# Patient Record
Sex: Male | Born: 1970 | Race: Black or African American | Hispanic: No | Smoking: Never smoker
Health system: Southern US, Community
[De-identification: ages and names within clinical notes are randomized; demographics above are authoritative.]

## PROBLEM LIST (undated history)

## (undated) DIAGNOSIS — Z9289 Personal history of other medical treatment: Secondary | ICD-10-CM

## (undated) DIAGNOSIS — E785 Hyperlipidemia, unspecified: Secondary | ICD-10-CM

## (undated) DIAGNOSIS — K921 Melena: Secondary | ICD-10-CM

## (undated) DIAGNOSIS — R197 Diarrhea, unspecified: Secondary | ICD-10-CM

## (undated) DIAGNOSIS — I10 Essential (primary) hypertension: Secondary | ICD-10-CM

## (undated) DIAGNOSIS — R112 Nausea with vomiting, unspecified: Secondary | ICD-10-CM

## (undated) DIAGNOSIS — E7800 Pure hypercholesterolemia, unspecified: Secondary | ICD-10-CM

## (undated) DIAGNOSIS — Z87898 Personal history of other specified conditions: Secondary | ICD-10-CM

## (undated) DIAGNOSIS — K219 Gastro-esophageal reflux disease without esophagitis: Secondary | ICD-10-CM

## (undated) DIAGNOSIS — G4730 Sleep apnea, unspecified: Secondary | ICD-10-CM

## (undated) HISTORY — DX: Hyperlipidemia, unspecified: E78.5

## (undated) HISTORY — DX: Sleep apnea, unspecified: G47.30

## (undated) HISTORY — DX: Nausea with vomiting, unspecified: R11.2

## (undated) HISTORY — DX: Melena: K92.1

## (undated) HISTORY — PX: VASECTOMY: SHX75

## (undated) HISTORY — DX: Diarrhea, unspecified: R19.7

## (undated) HISTORY — PX: FOOT SURGERY: SHX648

---

## 1998-01-13 ENCOUNTER — Emergency Department (HOSPITAL_COMMUNITY): Admission: EM | Admit: 1998-01-13 | Discharge: 1998-01-13 | Payer: Self-pay

## 1998-12-17 ENCOUNTER — Encounter: Payer: Self-pay | Admitting: Emergency Medicine

## 1998-12-17 ENCOUNTER — Emergency Department (HOSPITAL_COMMUNITY): Admission: EM | Admit: 1998-12-17 | Discharge: 1998-12-17 | Payer: Self-pay | Admitting: Emergency Medicine

## 1999-01-02 ENCOUNTER — Ambulatory Visit (HOSPITAL_BASED_OUTPATIENT_CLINIC_OR_DEPARTMENT_OTHER): Admission: RE | Admit: 1999-01-02 | Discharge: 1999-01-02 | Payer: Self-pay | Admitting: Orthopedic Surgery

## 2002-03-02 ENCOUNTER — Encounter: Payer: Self-pay | Admitting: Cardiology

## 2002-03-02 ENCOUNTER — Encounter: Admission: RE | Admit: 2002-03-02 | Discharge: 2002-03-02 | Payer: Self-pay | Admitting: Cardiology

## 2002-05-11 ENCOUNTER — Emergency Department (HOSPITAL_COMMUNITY): Admission: EM | Admit: 2002-05-11 | Discharge: 2002-05-11 | Payer: Self-pay | Admitting: Emergency Medicine

## 2005-10-13 ENCOUNTER — Ambulatory Visit: Payer: Self-pay | Admitting: Internal Medicine

## 2005-10-20 ENCOUNTER — Ambulatory Visit: Payer: Self-pay | Admitting: Internal Medicine

## 2007-03-19 ENCOUNTER — Ambulatory Visit: Payer: Self-pay | Admitting: Family Medicine

## 2007-05-16 ENCOUNTER — Telehealth (INDEPENDENT_AMBULATORY_CARE_PROVIDER_SITE_OTHER): Payer: Self-pay | Admitting: *Deleted

## 2007-05-16 ENCOUNTER — Emergency Department (HOSPITAL_COMMUNITY): Admission: EM | Admit: 2007-05-16 | Discharge: 2007-05-16 | Payer: Self-pay | Admitting: Emergency Medicine

## 2007-12-27 ENCOUNTER — Ambulatory Visit: Payer: Self-pay | Admitting: Internal Medicine

## 2007-12-27 LAB — CONVERTED CEMR LAB

## 2007-12-29 LAB — CONVERTED CEMR LAB
ALT: 37 units/L (ref 0–53)
AST: 28 units/L (ref 0–37)
Albumin: 4 g/dL (ref 3.5–5.2)
Alkaline Phosphatase: 58 units/L (ref 39–117)
BUN: 10 mg/dL (ref 6–23)
Basophils Absolute: 0 10*3/uL (ref 0.0–0.1)
Basophils Relative: 0 % (ref 0.0–1.0)
Bilirubin, Direct: 0.1 mg/dL (ref 0.0–0.3)
CO2: 31 meq/L (ref 19–32)
Calcium: 9.9 mg/dL (ref 8.4–10.5)
Chloride: 106 meq/L (ref 96–112)
Cholesterol: 278 mg/dL (ref 0–200)
Creatinine, Ser: 1.1 mg/dL (ref 0.4–1.5)
Direct LDL: 217.1 mg/dL
Eosinophils Absolute: 0.1 10*3/uL (ref 0.0–0.7)
Eosinophils Relative: 2.8 % (ref 0.0–5.0)
GFR calc Af Amer: 99 mL/min
GFR calc non Af Amer: 81 mL/min
Glucose, Bld: 76 mg/dL (ref 70–99)
HCT: 40 % (ref 39.0–52.0)
HDL: 31.2 mg/dL — ABNORMAL LOW (ref >39.0)
Hemoglobin: 13.1 g/dL (ref 13.0–17.0)
Lymphocytes Relative: 42.7 % (ref 12.0–46.0)
MCHC: 32.8 g/dL (ref 30.0–36.0)
MCV: 82.1 fL (ref 78.0–100.0)
Monocytes Absolute: 0.3 10*3/uL (ref 0.1–1.0)
Monocytes Relative: 6.4 % (ref 3.0–12.0)
Neutro Abs: 2.2 10*3/uL (ref 1.4–7.7)
Neutrophils Relative %: 48.1 % (ref 43.0–77.0)
Platelets: 181 10*3/uL (ref 150–400)
Potassium: 3.7 meq/L (ref 3.5–5.1)
RBC: 4.87 M/uL (ref 4.22–5.81)
RDW: 14 % (ref 11.5–14.6)
Sodium: 142 meq/L (ref 135–145)
TSH: 0.9 microintl units/mL (ref 0.35–5.50)
Total Bilirubin: 0.7 mg/dL (ref 0.3–1.2)
Total CHOL/HDL Ratio: 8.9
Total Protein: 7.9 g/dL (ref 6.0–8.3)
Triglycerides: 116 mg/dL (ref 0–149)
VLDL: 23 mg/dL (ref 0–40)
WBC: 4.6 10*3/uL (ref 4.5–10.5)

## 2008-04-27 ENCOUNTER — Encounter (INDEPENDENT_AMBULATORY_CARE_PROVIDER_SITE_OTHER): Payer: Self-pay | Admitting: *Deleted

## 2008-11-16 ENCOUNTER — Emergency Department (HOSPITAL_COMMUNITY): Admission: EM | Admit: 2008-11-16 | Discharge: 2008-11-16 | Payer: Self-pay | Admitting: Family Medicine

## 2008-11-22 ENCOUNTER — Telehealth (INDEPENDENT_AMBULATORY_CARE_PROVIDER_SITE_OTHER): Payer: Self-pay | Admitting: *Deleted

## 2008-12-04 ENCOUNTER — Encounter: Payer: Self-pay | Admitting: Internal Medicine

## 2009-03-25 ENCOUNTER — Encounter (INDEPENDENT_AMBULATORY_CARE_PROVIDER_SITE_OTHER): Payer: Self-pay | Admitting: *Deleted

## 2010-10-29 ENCOUNTER — Emergency Department (HOSPITAL_COMMUNITY): Payer: 59

## 2010-10-29 ENCOUNTER — Emergency Department (HOSPITAL_COMMUNITY)
Admission: EM | Admit: 2010-10-29 | Discharge: 2010-10-29 | Disposition: A | Discharge status: Home / Self Care | Payer: 59 | Attending: Emergency Medicine | Admitting: Emergency Medicine

## 2010-10-29 ENCOUNTER — Encounter (HOSPITAL_COMMUNITY): Payer: Self-pay | Admitting: Radiology

## 2010-10-29 DIAGNOSIS — R0989 Other specified symptoms and signs involving the circulatory and respiratory systems: Secondary | ICD-10-CM | POA: Insufficient documentation

## 2010-10-29 DIAGNOSIS — I440 Atrioventricular block, first degree: Secondary | ICD-10-CM | POA: Insufficient documentation

## 2010-10-29 DIAGNOSIS — R209 Unspecified disturbances of skin sensation: Secondary | ICD-10-CM | POA: Insufficient documentation

## 2010-10-29 DIAGNOSIS — R0609 Other forms of dyspnea: Secondary | ICD-10-CM | POA: Insufficient documentation

## 2010-10-29 DIAGNOSIS — R079 Chest pain, unspecified: Secondary | ICD-10-CM | POA: Insufficient documentation

## 2010-10-29 LAB — CBC
HCT: 41.4 % (ref 39.0–52.0)
Hemoglobin: 12.9 g/dL — ABNORMAL LOW (ref 13.0–17.0)
MCH: 25.9 pg — ABNORMAL LOW (ref 26.0–34.0)
MCHC: 31.2 g/dL (ref 30.0–36.0)
MCV: 83.1 fL (ref 78.0–100.0)
Platelets: 188 10*3/uL (ref 150–400)
RBC: 4.98 MIL/uL (ref 4.22–5.81)
RDW: 15.1 % (ref 11.5–15.5)
WBC: 6.4 10*3/uL (ref 4.0–10.5)

## 2010-10-29 LAB — BASIC METABOLIC PANEL
CO2: 27 mEq/L (ref 19–32)
Calcium: 9.7 mg/dL (ref 8.4–10.5)
Glucose, Bld: 91 mg/dL (ref 70–99)
Sodium: 139 mEq/L (ref 135–145)

## 2010-10-29 LAB — POCT CARDIAC MARKERS
CKMB, poc: 1.5 ng/mL (ref 1.0–8.0)
CKMB, poc: 7.9 ng/mL (ref 1.0–8.0)
Myoglobin, poc: 100 ng/mL (ref 12–200)
Troponin i, poc: <0.05 ng/mL (ref 0.00–0.09)

## 2010-10-29 LAB — DIFFERENTIAL
Basophils Absolute: 0 10*3/uL (ref 0.0–0.1)
Basophils Relative: 0 % (ref 0–1)
Eosinophils Absolute: 0.1 10*3/uL (ref 0.0–0.7)
Eosinophils Relative: 2 % (ref 0–5)
Lymphocytes Relative: 31 % (ref 12–46)
Lymphs Abs: 2 10*3/uL (ref 0.7–4.0)
Monocytes Absolute: 0.4 10*3/uL (ref 0.1–1.0)
Monocytes Relative: 6 % (ref 3–12)
Neutro Abs: 3.9 10*3/uL (ref 1.7–7.7)
Neutrophils Relative %: 61 % (ref 43–77)

## 2011-05-21 LAB — POCT RAPID STREP A: Streptococcus, Group A Screen (Direct): NEGATIVE

## 2011-08-29 ENCOUNTER — Emergency Department (HOSPITAL_COMMUNITY): Payer: 59

## 2011-08-29 ENCOUNTER — Emergency Department (HOSPITAL_COMMUNITY)
Admission: EM | Admit: 2011-08-29 | Discharge: 2011-08-29 | Disposition: A | Discharge status: Home / Self Care | Payer: 59 | Attending: Emergency Medicine | Admitting: Emergency Medicine

## 2011-08-29 ENCOUNTER — Encounter (HOSPITAL_COMMUNITY): Payer: Self-pay | Admitting: *Deleted

## 2011-08-29 DIAGNOSIS — R319 Hematuria, unspecified: Secondary | ICD-10-CM | POA: Insufficient documentation

## 2011-08-29 DIAGNOSIS — R109 Unspecified abdominal pain: Secondary | ICD-10-CM | POA: Insufficient documentation

## 2011-08-29 DIAGNOSIS — K922 Gastrointestinal hemorrhage, unspecified: Secondary | ICD-10-CM | POA: Insufficient documentation

## 2011-08-29 DIAGNOSIS — K921 Melena: Secondary | ICD-10-CM | POA: Insufficient documentation

## 2011-08-29 LAB — PROTIME-INR
INR: 0.97 (ref 0.00–1.49)
Prothrombin Time: 13.1 seconds (ref 11.6–15.2)

## 2011-08-29 LAB — CBC
HCT: 43.4 % (ref 39.0–52.0)
Platelets: 179 10*3/uL (ref 150–400)
RDW: 15.1 % (ref 11.5–15.5)
WBC: 6.2 10*3/uL (ref 4.0–10.5)

## 2011-08-29 LAB — URINE MICROSCOPIC-ADD ON

## 2011-08-29 LAB — COMPREHENSIVE METABOLIC PANEL
Albumin: 3.8 g/dL (ref 3.5–5.2)
Alkaline Phosphatase: 71 U/L (ref 39–117)
BUN: 12 mg/dL (ref 6–23)
Calcium: 9.6 mg/dL (ref 8.4–10.5)
Creatinine, Ser: 0.99 mg/dL (ref 0.50–1.35)
GFR calc Af Amer: >90 mL/min (ref >90)
Glucose, Bld: 98 mg/dL (ref 70–99)
Total Protein: 7.9 g/dL (ref 6.0–8.3)

## 2011-08-29 LAB — URINALYSIS, ROUTINE W REFLEX MICROSCOPIC
Glucose, UA: NEGATIVE mg/dL
Ketones, ur: NEGATIVE mg/dL
Leukocytes, UA: NEGATIVE
Protein, ur: NEGATIVE mg/dL
Urobilinogen, UA: 0.2 mg/dL (ref 0.0–1.0)

## 2011-08-29 LAB — URINE CULTURE: Colony Count: NO GROWTH

## 2011-08-29 LAB — APTT: aPTT: 29 seconds (ref 24–37)

## 2011-08-29 LAB — DIFFERENTIAL
Basophils Relative: 0 % (ref 0–1)
Eosinophils Absolute: 0.1 10*3/uL (ref 0.0–0.7)
Eosinophils Relative: 2 % (ref 0–5)
Lymphs Abs: 2.2 10*3/uL (ref 0.7–4.0)
Monocytes Absolute: 0.4 10*3/uL (ref 0.1–1.0)
Monocytes Relative: 7 % (ref 3–12)
Neutrophils Relative %: 55 % (ref 43–77)

## 2011-08-29 MED ORDER — OXYCODONE-ACETAMINOPHEN 5-325 MG PO TABS: 2.0000 | ORAL_TABLET | ORAL | Status: AC | PRN

## 2011-08-29 MED ORDER — SODIUM CHLORIDE 0.9 % IV SOLN
Freq: Once | INTRAVENOUS | Status: AC
  Administered 2011-08-29: 08:00:00 via INTRAVENOUS

## 2011-08-29 NOTE — ED Provider Notes (Signed)
History     CSN: [id number]  Arrival date & time 08/29/11  0746   First MD Initiated Contact with Patient 08/29/11 0750      Chief Complaint  Patient presents with  . Hematuria  . Rectal Bleeding  . Flank Pain    (Consider location/radiation/quality/duration/timing/severity/associated sxs/prior treatment) Patient is a 41 y.o. male presenting with hematuria, hematochezia, and flank pain. The history is provided by the patient.  Hematuria Associated symptoms include flank pain.  Rectal Bleeding  Associated symptoms include hematuria.  Flank Pain   patient here with right-sided flank pain since this morning. He also notes hematuria as well as some blood in the stools. Denies any fever, vomiting. Nothing makes her symptoms better or worse. No prior history of same. No medications taken prior to arrival. Denies any history of colon cancer or kidney stones. States his stools have been slightly dark and that today when he moves his bowels he did note blood which was bright red. He also noted bright red blood when he urinates as well. Denies any syncope or dizziness History reviewed. No pertinent past medical history.  History reviewed. No pertinent past surgical history.  History reviewed. No pertinent family history.  History  Substance Use Topics  . Smoking status: Not on file  . Smokeless tobacco: Not on file  . Alcohol Use: Not on file      Review of Systems  Gastrointestinal: Positive for hematochezia.  Genitourinary: Positive for hematuria and flank pain.  All other systems reviewed and are negative.    Allergies  Review of patient's allergies indicates not on file.  Home Medications   Current Outpatient Rx  Name Route Sig Dispense Refill  . ACETAMINOPHEN 500 MG PO TABS Oral Take 500 mg by mouth every 6 (six) hours as needed. For headache    . BC HEADACHE POWDER PO Oral Take 1 packet by mouth daily as needed. For headache    . CLEAR EYES OP Both Eyes Place 1  drop into both eyes daily as needed. For dry or itchy eyes      There were no vitals taken for this visit.  Physical Exam  Nursing note and vitals reviewed. Constitutional: He is oriented to person, place, and time. He appears well-developed and well-nourished.  Non-toxic appearance. No distress.  HENT:  Head: Normocephalic and atraumatic.  Eyes: Conjunctivae, EOM and lids are normal. Pupils are equal, round, and reactive to light.  Neck: Normal range of motion. Neck supple. No tracheal deviation present. No mass present.  Cardiovascular: Normal rate, regular rhythm and normal heart sounds.  Exam reveals no gallop.   No murmur heard. Pulmonary/Chest: Effort normal and breath sounds normal. No stridor. No respiratory distress. He has no decreased breath sounds. He has no wheezes. He has no rhonchi. He has no rales.  Abdominal: Soft. Normal appearance and bowel sounds are normal. He exhibits no distension. There is no tenderness. There is CVA tenderness. There is no rigidity, no rebound and no guarding.  Genitourinary: Testes normal. Guaiac negative stool.  Musculoskeletal: Normal range of motion. He exhibits no edema and no tenderness.  Neurological: He is alert and oriented to person, place, and time. He has normal strength. No cranial nerve deficit or sensory deficit. GCS eye subscore is 4. GCS verbal subscore is 5. GCS motor subscore is 6.  Skin: Skin is warm and dry. No abrasion and no rash noted.  Psychiatric: He has a normal mood and affect. His speech is normal and behavior  is normal.    ED Course  Procedures (including critical care time)   Labs Reviewed  CBC  DIFFERENTIAL  COMPREHENSIVE METABOLIC PANEL  PROTIME-INR  APTT  URINALYSIS, ROUTINE W REFLEX MICROSCOPIC  URINE CULTURE   No results found.   No diagnosis found.    MDM  Patient without signs of GI bleeding at this time. The patient's urine and CT results noted. Will be placed on pain medication and followup  with Dr. as needed        Toy Baker, MD 08/29/11 [phone]

## 2011-08-29 NOTE — ED Notes (Signed)
Pt reports first noticing blood in urine and stools last night 1230. Noticed again this am, dark red blood in stools. Having mild right side/flank pain.

## 2011-09-01 ENCOUNTER — Encounter: Payer: Self-pay | Admitting: Internal Medicine

## 2011-09-03 ENCOUNTER — Ambulatory Visit (INDEPENDENT_AMBULATORY_CARE_PROVIDER_SITE_OTHER): Payer: 59 | Admitting: Internal Medicine

## 2011-09-03 ENCOUNTER — Other Ambulatory Visit (INDEPENDENT_AMBULATORY_CARE_PROVIDER_SITE_OTHER): Payer: 59

## 2011-09-03 ENCOUNTER — Encounter: Payer: Self-pay | Admitting: Internal Medicine

## 2011-09-03 DIAGNOSIS — K219 Gastro-esophageal reflux disease without esophagitis: Secondary | ICD-10-CM | POA: Insufficient documentation

## 2011-09-03 DIAGNOSIS — R319 Hematuria, unspecified: Secondary | ICD-10-CM

## 2011-09-03 DIAGNOSIS — R110 Nausea: Secondary | ICD-10-CM

## 2011-09-03 DIAGNOSIS — R109 Unspecified abdominal pain: Secondary | ICD-10-CM

## 2011-09-03 DIAGNOSIS — R1110 Vomiting, unspecified: Secondary | ICD-10-CM

## 2011-09-03 DIAGNOSIS — R1011 Right upper quadrant pain: Secondary | ICD-10-CM

## 2011-09-03 LAB — LIPASE: Lipase: 17 U/L (ref 11.0–59.0)

## 2011-09-03 LAB — COMPREHENSIVE METABOLIC PANEL
AST: 24 U/L (ref 0–37)
Alkaline Phosphatase: 72 U/L (ref 39–117)
BUN: 13 mg/dL (ref 6–23)
Glucose, Bld: 86 mg/dL (ref 70–99)
Sodium: 140 mEq/L (ref 135–145)
Total Bilirubin: 0.7 mg/dL (ref 0.3–1.2)
Total Protein: 8.1 g/dL (ref 6.0–8.3)

## 2011-09-03 MED ORDER — PEG-KCL-NACL-NASULF-NA ASC-C 100 G PO SOLR: 1.0000 | Freq: Once | ORAL | Status: DC

## 2011-09-03 MED ORDER — ONDANSETRON HCL 4 MG PO TABS: 4.0000 mg | ORAL_TABLET | Freq: Every day | ORAL | Status: DC | PRN

## 2011-09-03 MED ORDER — ESOMEPRAZOLE MAGNESIUM 20 MG PO CPDR: 20.0000 mg | DELAYED_RELEASE_CAPSULE | Freq: Every day | ORAL | Status: DC

## 2011-09-03 MED ORDER — TRAMADOL HCL 50 MG PO TABS: 50.0000 mg | ORAL_TABLET | Freq: Four times a day (QID) | ORAL | Status: DC | PRN

## 2011-09-03 NOTE — Patient Instructions (Addendum)
You have been scheduled for an endoscopy. Please follow written instructions given to you at your visit today.  You have been scheduled for an abdominal ultrasound at [facility] (1st floor of hospital) on 09/08/2011 at 9:30am. Please arrive 15 minutes prior to your appointment for registration. Make certain not to have anything to eat or drink 6 hours prior to your appointment. Should you need to reschedule your appointment, please contact radiology at [phone].

## 2011-09-03 NOTE — Progress Notes (Signed)
Subjective:    Patient ID: Timothy Nunez, male    DOB: Mar 07, 1971, 41 y.o.   MRN: [id number]  HPI Timothy Nunez is a 41 year old male with a past medical history of hyperlipidemia and sleep apnea who is seen in consultation by the request of Dr. Drue Novel for evaluation of abdominal pain with nausea and vomiting.  Patient is accompanied today by a good friend. He reports about 9 months of intermittent right flank and right upper quadrant abdominal pain. This is become significantly worse over the last few weeks, and necessitated an ER visit in January of 2013. During that evaluation he underwent CT scan and evaluation for possible kidney stone, but the CT scan was unremarkable. He was subsequently referred to the visit with me.  He reports pain in the right mid axillary line and right upper quadrant which is worse after eating. It is particularly worse after spicy and fatty foods. Alcohol also makes this worse. He is at times awoken from sleep with this pain. It is associated with nausea and vomiting. He denies hematemesis. He has seen scant blood in his emesis however. He reports being afraid to eat though he does feel hungry. He reports a full feeling in his upper abdomen as well as bloating. He also endorses frequent heartburn but denies dysphagia or odynophagia. He reports alternating diarrhea and constipation but this is somewhat long-standing. He did see bright red blood in his stool on one occasion recently. He also reports hematuria recently. The bleeding has subsequently resolved. He denies fever or chills, endorses occasional night sweats  Review of Systems Constitutional: Negative for fever, chills, night sweats, activity change, appetite change and unexpected weight change HEENT: Negative for sore throat, mouth sores and trouble swallowing. Eyes: Negative for visual disturbance Respiratory: Positive for occasional cough, negative for chest tightness and shortness of breath Cardiovascular: Negative  for chest pain, palpitations and lower extremity swelling Gastrointestinal: See history of present illness Genitourinary: Negative for dysuria and positive for hematuria. Musculoskeletal: Positive for back pain, arthralgias and myalgias Skin: Negative for rash or color change Neurological: Negative for headaches, weakness, numbness Hematological: Negative for adenopathy, negative for easy bruising/bleeding Psychiatric/behavioral: Negative for depressed mood, negative for anxiety   Past Medical History  Diagnosis Date  . Hyperlipemia   . Sleep apnea    Past Surgical History  Procedure Date  . Foot surgery     left    Current Outpatient Prescriptions  Medication Sig Dispense Refill  . acetaminophen (TYLENOL) 500 MG tablet Take 500 mg by mouth every 6 (six) hours as needed. For headache      . Aspirin-Salicylamide-Caffeine (BC HEADACHE POWDER PO) Take 1 packet by mouth daily as needed. For headache      . Naphazoline HCl (CLEAR EYES OP) Place 1 drop into both eyes daily as needed. For dry or itchy eyes      . oxyCODONE-acetaminophen (PERCOCET) 5-325 MG per tablet Take 2 tablets by mouth every 4 (four) hours as needed for pain.  10 tablet  0  . esomeprazole (NEXIUM) 20 MG capsule Take 1 capsule (20 mg total) by mouth daily.  30 capsule  1  . ondansetron (ZOFRAN) 4 MG tablet Take 1 tablet (4 mg total) by mouth daily as needed for nausea.  30 tablet  1  . traMADol (ULTRAM) 50 MG tablet Take 1 tablet (50 mg total) by mouth every 6 (six) hours as needed for pain (if tolarable can go up 100mg ).  20 tablet  0  No Known Allergies  Family History  Problem Relation Age of Onset  . Colon cancer Neg Hx   . Colon polyps Father   . Pancreatic cancer Paternal Grandfather   DM- dad CVA -mom  Social History  . Marital Status: Married   Occupational History  .  [employer]   Social History Main Topics  . Smoking status: Never Smoker   . Smokeless tobacco: Never Used   Comment:  Around tobacco   . Alcohol Use: Yes     occ  . Drug Use: No      Objective:   Physical Exam BP 134/78  Pulse 72  Ht 6\' 3"  (1.905 m)  Wt 337 lb (152.862 kg)  BMI 42.12 kg/m2 Constitutional: Well-developed and well-nourished. No distress. HEENT: Normocephalic and atraumatic. Oropharynx is clear and moist. No oropharyngeal exudate. Conjunctivae are normal. Pupils are equal round and reactive to light. No scleral icterus. Neck: Neck supple. Trachea midline. Cardiovascular: Normal rate, regular rhythm and intact distal pulses. No M/R/G Pulmonary/chest: Effort normal and breath sounds normal. No wheezing, rales or rhonchi. Abdominal: Soft, tender to palpation in the right upper quadrant and epigastrium without rebound or guarding, nondistended. Bowel sounds active throughout. There are no masses palpable. No hepatosplenomegaly. In the right mid axillary line below the axilla there is a subcutaneous area which is very tender to palpation but there is no overlying skin rash Extremities: no clubbing, cyanosis, or edema Lymphadenopathy: No cervical adenopathy noted. Neurological: Alert and oriented to person place and time. Skin: Skin is warm and dry. No rashes noted. Psychiatric: Normal mood and affect. Behavior is normal. CMP     Component Value Date/Time   NA 137 08/29/2011 0807   K 4.0 08/29/2011 0807   CL 101 08/29/2011 0807   CO2 27 08/29/2011 0807   GLUCOSE 98 08/29/2011 0807   BUN 12 08/29/2011 0807   CREATININE 0.99 08/29/2011 0807   CALCIUM 9.6 08/29/2011 0807   PROT 7.9 08/29/2011 0807   ALBUMIN 3.8 08/29/2011 0807   AST 21 08/29/2011 0807   ALT 26 08/29/2011 0807   ALKPHOS 71 08/29/2011 0807   BILITOT 0.3 08/29/2011 0807   GFRNONAA >90 08/29/2011 0807   GFRAA >90 08/29/2011 0807   CBC    Component Value Date/Time   WBC 6.2 08/29/2011 0807   RBC 5.19 08/29/2011 0807   HGB 14.1 08/29/2011 0807   HCT 43.4 08/29/2011 0807   PLT 179 08/29/2011 0807   MCV 83.6 08/29/2011 0807   MCH 27.2  08/29/2011 0807   MCHC 32.5 08/29/2011 0807   RDW 15.1 08/29/2011 0807   LYMPHSABS 2.2 08/29/2011 0807   MONOABS 0.4 08/29/2011 0807   EOSABS 0.1 08/29/2011 0807   BASOSABS 0.0 08/29/2011 0807    CT ABDOMEN AND PELVIS WITHOUT CONTRAST 08/29/2011  Technique: Multidetector CT imaging of the abdomen and pelvis was  performed following the standard protocol without intravenous  contrast.  Comparison: None.  Findings: Lung bases are clear.  Unenhanced liver, spleen, pancreas, and adrenal glands within  normal limits.  Gallbladder is unremarkable. No intrahepatic or extrahepatic  ductal dilatation.  Kidneys within normal limits. Calculi or hydronephrosis.  No evidence of bowel obstruction. Normal appendix.  No evidence of abdominal aortic aneurysm.  No abdominopelvic ascites.  No suspicious abdominopelvic lymphadenopathy.  Prostate is unremarkable.  No ureteral or bladder calculi.  Mild degenerative changes of the visualized thoracolumbar spine.   IMPRESSION:  No renal, ureteral, or bladder calculi. No hydronephrosis.  Normal appendix. No  evidence of bowel obstruction.  No CT findings to account for the patient's abdominal pain.      Assessment & Plan:  41 year old male with a past medical history of hyperlipidemia and sleep apnea who is seen in consultation by the request of Dr. Drue Novel for evaluation of abdominal pain with nausea and vomiting  1. Abd pain/N/V -- based on the patient's symptoms, the differential includes biliary pain and peptic ulcer disease. He is not currently on acid suppression therapy, and he may in fact benefit from PPI. I have recommended Nexium 40 mg daily and instruct him to take this 30 minutes to one hour before his first meal of the day. I also recommended upper endoscopy to rule out mucosal-based lesion such as ulcer disease. He is agreeable to proceed. I also would like to further evaluate his gallbladder with an ultrasound and this is been ordered today as well.  Given the tenderness in the subcutaneous tissue in the right mid axillary line, I will request a soft tissue ultrasound of this area at the same time as his gallbladder ultrasound. Labs were done recently and unremarkable, however a lipase was not performed and I'll perform this today to rule out pancreatic inflammation. No pancreatitis was seen by CT, but this was a noncontrasted study. I will give him a prescription for tramadol 5200 mg every 6-8 hours when necessary pain. He can take this in place of Percocet and hopefully this will be less sedating. I also will give him a prescription for Zofran to be used as needed and as directed for nausea.  Further recommendations to be made after these studies are complete  2. Hematuria -- the patient did have some trace hematuria and urinalysis performed in the emergency department. However no stones were seen on CT. This can be followed by his primary care provider and neurology felt necessary.

## 2011-09-04 ENCOUNTER — Telehealth: Payer: Self-pay | Admitting: *Deleted

## 2011-09-04 NOTE — Telephone Encounter (Signed)
lmom for pt to call back for results

## 2011-09-04 NOTE — Telephone Encounter (Signed)
Informed pt of his normal labs and he stated when he picked up his script, there was a Movi Prep. Pt is scheduled for an EGD on 08/2811. Looked on med list and the Movi Prep was cancelled. Spoke with pharmacist at [facility] and they will take back the box if unopened. lmom for pt.

## 2011-09-04 NOTE — Telephone Encounter (Signed)
Message copied by Florene Glen on Fri Sep 04, 2011  8:34 AM ------      Message from: Beverley Fiedler      Created: Thu Sep 03, 2011 10:28 PM       Lipase (pancreas) and metabolic panel normal      Proceed with our plan.

## 2011-09-07 ENCOUNTER — Encounter: Payer: Self-pay | Admitting: Internal Medicine

## 2011-09-07 ENCOUNTER — Ambulatory Visit (AMBULATORY_SURGERY_CENTER): Payer: 59 | Admitting: Internal Medicine

## 2011-09-07 DIAGNOSIS — K2990 Gastroduodenitis, unspecified, without bleeding: Secondary | ICD-10-CM

## 2011-09-07 DIAGNOSIS — K2970 Gastritis, unspecified, without bleeding: Secondary | ICD-10-CM

## 2011-09-07 DIAGNOSIS — R110 Nausea: Secondary | ICD-10-CM

## 2011-09-07 DIAGNOSIS — K219 Gastro-esophageal reflux disease without esophagitis: Secondary | ICD-10-CM

## 2011-09-07 DIAGNOSIS — R109 Unspecified abdominal pain: Secondary | ICD-10-CM

## 2011-09-07 DIAGNOSIS — D130 Benign neoplasm of esophagus: Secondary | ICD-10-CM

## 2011-09-07 MED ORDER — SODIUM CHLORIDE 0.9 % IV SOLN: 500.0000 mL | INTRAVENOUS | Status: DC

## 2011-09-07 NOTE — Progress Notes (Signed)
Patient did not experience any of the following events: a burn prior to discharge; a fall within the facility; wrong site/side/patient/procedure/implant event; or a hospital transfer or hospital admission upon discharge from the facility. (G8907) Patient did not have preoperative order for IV antibiotic SSI prophylaxis. (G8918)  

## 2011-09-07 NOTE — Patient Instructions (Addendum)
FOLLOW DISCHARGE INSTRUCTIONS (BLUE AND GREEN SHEETS).. 

## 2011-09-07 NOTE — Op Note (Signed)
[facility] [address] [place], [place]  [address]  ENDOSCOPY PROCEDURE REPORT  PATIENT:  Timothy Nunez, Timothy Nunez  MR#:  [id number] BIRTHDATE:  [birth year], 40 yrs. old  GENDER:  male ENDOSCOPIST:  Carie Caddy. Deontre Allsup, MD Referred by:  Willow Ora, M.D. PROCEDURE DATE:  09/07/2011 PROCEDURE:  EGD with biopsy, 43239 ASA CLASS:  Class II INDICATIONS:  abdominal pain, GERD, nausea with vomiting MEDICATIONS:   MAC sedation, administered by CRNA, propofol (Diprivan) 400 mg IV TOPICAL ANESTHETIC:  none  DESCRIPTION OF PROCEDURE:   After the risks benefits and alternatives of the procedure were thoroughly explained, informed consent was obtained.  The LB [id number] endoscope was introduced through the mouth and advanced to the second portion of the duodenum, without limitations.  The instrument was slowly withdrawn as the mucosa was fully examined. 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 Random biopsies were obtained and sent to pathology to exclude EoE.  Slightly irregular Z-line located at 40 cm.  Mild gastritis with a few superficial erosions was found in the body and the antrum of the stomach. Biopsies of the antrum and body of the stomach were obtained and sent to pathology.  The duodenal bulb was normal in appearance, as was the postbulbar duodenum. Retroflexed views revealed a small hiatal hernia.    The scope was then withdrawn from the patient and the procedure completed.  COMPLICATIONS:  None  ENDOSCOPIC IMPRESSION: 1) Trachealization of esophageal mucosa.  Random biopsies performed. 2) Slightly irregular Z-line 3) A small hiatal hernia 4) Mild gastritis in the body and the antrum of the stomach. Biopsies performed to rule out H. pylori. 5) Normal duodenum  RECOMMENDATIONS: 1) Await pathology results 2) Follow-up of helicobacter pylori status, treat if indicated 3) Continue taking your PPI (Nexium) once daily. It  is best to be taken 20-30 minutes prior to breakfast meal. 4) Will followup ultrasound results. 5) Return to clinic once studies available.  Carie Caddy. Rhea Belton, MD  CC:  Willow Ora, MD The Patient  n. eSIGNED:   Carie Caddy. Midas Daughety at 09/07/2011 03:12 PM  [place], [phone]

## 2011-09-08 ENCOUNTER — Ambulatory Visit (HOSPITAL_COMMUNITY)
Admission: RE | Admit: 2011-09-08 | Discharge: 2011-09-08 | Disposition: A | Discharge status: Home / Self Care | Payer: 59 | Source: Ambulatory Visit | Attending: Internal Medicine | Admitting: Internal Medicine

## 2011-09-08 ENCOUNTER — Telehealth: Payer: Self-pay | Admitting: *Deleted

## 2011-09-08 DIAGNOSIS — R110 Nausea: Secondary | ICD-10-CM

## 2011-09-08 DIAGNOSIS — R109 Unspecified abdominal pain: Secondary | ICD-10-CM | POA: Insufficient documentation

## 2011-09-08 NOTE — Telephone Encounter (Signed)
  Follow up Call-  Call back number 09/07/2011  Post procedure Call Back phone  # [phone]  Permission to leave phone message Yes     Patient questions:  Do you have a fever, pain , or abdominal swelling? no Pain Score  0 *  Have you tolerated food without any problems? yes  Have you been able to return to your normal activities? yes  Do you have any questions about your discharge instructions: Diet   no Medications  no Follow up visit  no  Do you have questions or concerns about your Care? no  Actions: * If pain score is 4 or above: No action needed, pain <4.   Pt. Denies, pain.  He is hoarse.  Advised to call office within next couple of days if hoarseness does not clear.

## 2011-09-08 NOTE — Telephone Encounter (Signed)
Informed pt of Dr Lauro Franklin finding and recommendations; we may know more when we get the path back from biopsies; pt stated understanding.

## 2011-09-08 NOTE — Telephone Encounter (Signed)
Message copied by Florene Glen on Tue Sep 08, 2011  4:48 PM ------      Message from: Beverley Fiedler      Created: Tue Sep 08, 2011 12:47 PM       Abdominal ultrasound is normal. There is no evidence of gallbladder disease or stones.      No acute findings to explain his symptoms      He should continue his PPI and let's await the results of recent esophagus and stomach biopsies

## 2011-09-14 ENCOUNTER — Telehealth: Payer: Self-pay | Admitting: Internal Medicine

## 2011-09-14 ENCOUNTER — Encounter: Payer: Self-pay | Admitting: Internal Medicine

## 2011-09-14 DIAGNOSIS — M79629 Pain in unspecified upper arm: Secondary | ICD-10-CM

## 2011-09-14 NOTE — Telephone Encounter (Signed)
Dr Rhea Belton, please advise about path results and pt's next step since U/S was basically normal. Thanks.

## 2011-09-14 NOTE — Telephone Encounter (Signed)
I sent a path letter this morning. Path was unrevealing and neg for H. Pylori. Has the Nexium helped at all?  What are his symptoms like now? Thanks

## 2011-09-14 NOTE — Telephone Encounter (Signed)
lmom for pt to call back

## 2011-09-14 NOTE — Telephone Encounter (Signed)
There is a firmness in the size just below the axilla.  I think he should see a general surgeon for another opinion.  This is not abd pain and is very reproducible with palpation/light touch. [facility] referral please. Cont Nexium Thanks

## 2011-09-14 NOTE — Telephone Encounter (Signed)
Informed pt his path reports was unrevealing for his initial problems. Pt reports the Nexium has not helped with the pain in his r side-4 inches below his armpit; he reports nausea at times as well. He was given Tramadol which he reports helps "some". Please advise.

## 2011-09-15 ENCOUNTER — Ambulatory Visit: Payer: 59 | Admitting: Internal Medicine

## 2011-09-15 ENCOUNTER — Telehealth: Payer: Self-pay | Admitting: *Deleted

## 2011-09-15 ENCOUNTER — Encounter: Payer: Self-pay | Admitting: *Deleted

## 2011-09-15 NOTE — Telephone Encounter (Signed)
Letter mailed to pt- could not complete in system. Appointment card also sent ; 10/13/11 at 1100am.

## 2011-09-15 NOTE — Telephone Encounter (Signed)
Message copied by Florene Glen on Tue Sep 15, 2011  4:24 PM ------      Message from: Marnette Burgess      Created: Tue Sep 15, 2011  4:17 PM      Regarding: Referral       Patient is scheduled to see Dr. Abigail Miyamoto on 09/29/11 @ 11:30am, arrive @ 11:00am.  If you have any questions please call [phone].            Thank You,       Elane Fritz      ----- Message -----         From: Linna Hoff, RN         Sent: 09/15/2011   3:48 PM           To: Rollen Sox, referral for pt with pain in his right axilla even with the nipple line. Pt has no preference for a surgeon; would like the 1st available. Thanks. Aram Beecham

## 2011-09-15 NOTE — Telephone Encounter (Signed)
Notified pt of his appt. At [facility]; mailed him a map with the appt; pt stated understanding.

## 2011-09-15 NOTE — Telephone Encounter (Signed)
Spoke with pt to inform him I mailed him a letter with his f/u appt. Also informed him that Dr Rhea Belton stated the area of the pain under his r axilla is not GI related and he would like to refer him to a surgeon. Pt states the area is under his arm in line with the nipple. Pt stated understanding, but doesn't know any surgeon; 1st available is ok.

## 2011-09-29 ENCOUNTER — Encounter (INDEPENDENT_AMBULATORY_CARE_PROVIDER_SITE_OTHER): Payer: Self-pay | Admitting: Surgery

## 2011-09-29 ENCOUNTER — Ambulatory Visit (INDEPENDENT_AMBULATORY_CARE_PROVIDER_SITE_OTHER): Payer: 59 | Admitting: Surgery

## 2011-09-29 VITALS — BP 144/82 | HR 71 | Temp 97.8°F | Ht 75.0 in | Wt 336.8 lb

## 2011-09-29 DIAGNOSIS — R1011 Right upper quadrant pain: Secondary | ICD-10-CM | POA: Insufficient documentation

## 2011-09-29 NOTE — Progress Notes (Signed)
Patient ID: Timothy Nunez, male   DOB: 10/09/1970, 41 y.o.   MRN: [id number]  Chief Complaint  Patient presents with  . Other    axillary pain    HPI Timothy Nunez is a 41 y.o. male.   HPI This is a pleasant gentleman referred by Dr. Pyrtle for evaluation of right flank pain. The patient reports that he has pain in his right flank for approximately 9 months. It will hurt when he lays on his side. He will have nausea and vomiting with fatty meals and pain with fatty meals on occasion. He hurts almost every day. He denies fevers or chills. The pain is sharp and moderate in intensity. He has had a workup which has included a negative upper endoscopy, and negative ultrasound, and an unremarkable CAT scan of the abdomen and pelvis. Past Medical History  Diagnosis Date  . Hyperlipemia   . Sleep apnea   . Chest pain   . Abdominal pain   . Blood in stool   . Nausea vomiting and diarrhea     Past Surgical History  Procedure Date  . Foot surgery     left   . Vasectomy     Family History  Problem Relation Age of Onset  . Colon cancer Neg Hx   . Colon polyps Father   . Pancreatic cancer Paternal Grandfather   . Cancer Maternal Grandfather     colon    Social History History  Substance Use Topics  . Smoking status: Never Smoker   . Smokeless tobacco: Never Used   Comment: Around tobacco   . Alcohol Use: Yes     occ    No Known Allergies  Current Outpatient Prescriptions  Medication Sig Dispense Refill  . acetaminophen (TYLENOL) 500 MG tablet Take 500 mg by mouth every 6 (six) hours as needed. For headache      . Aspirin-Salicylamide-Caffeine (BC HEADACHE POWDER PO) Take 1 packet by mouth daily as needed. For headache      . esomeprazole (NEXIUM) 20 MG capsule Take 1 capsule (20 mg total) by mouth daily.  30 capsule  1  . Naphazoline HCl (CLEAR EYES OP) Place 1 drop into both eyes daily as needed. For dry or itchy eyes      . ondansetron (ZOFRAN) 4 MG tablet Take 1 tablet  (4 mg total) by mouth daily as needed for nausea.  30 tablet  1  . traMADol (ULTRAM) 50 MG tablet Take 1 tablet (50 mg total) by mouth every 6 (six) hours as needed for pain (if tolarable can go up 100mg).  20 tablet  0    Review of Systems Review of Systems  Constitutional: Negative for fever, chills and unexpected weight change.  HENT: Negative for hearing loss, congestion, sore throat, trouble swallowing and voice change.   Eyes: Negative for visual disturbance.  Respiratory: Positive for apnea. Negative for cough and wheezing.   Cardiovascular: Negative for chest pain, palpitations and leg swelling.  Gastrointestinal: Positive for nausea, vomiting, abdominal pain and blood in stool. Negative for diarrhea, constipation, abdominal distention, anal bleeding and rectal pain.  Genitourinary: Negative for hematuria and difficulty urinating.  Musculoskeletal: Negative for arthralgias.  Skin: Negative for rash and wound.  Neurological: Negative for seizures, syncope, weakness and headaches.  Hematological: Negative for adenopathy. Does not bruise/bleed easily.  Psychiatric/Behavioral: Negative for confusion.    Blood pressure 144/82, pulse 71, temperature 97.8 F (36.6 C), temperature source Temporal, height 6' 3" (1.905 m), weight   336 lb 12.8 oz (152.771 kg), SpO2 98.00%.  Physical Exam Physical Exam  Constitutional: He is oriented to person, place, and time. He appears well-developed and well-nourished. No distress.  HENT:  Head: Normocephalic and atraumatic.  Right Ear: External ear normal.  Left Ear: External ear normal.  Nose: Nose normal.  Mouth/Throat: Oropharynx is clear and moist. No oropharyngeal exudate.  Eyes: Conjunctivae are normal. Pupils are equal, round, and reactive to light. Right eye exhibits no discharge. Left eye exhibits no discharge. No scleral icterus.  Neck: Normal range of motion. Neck supple. No tracheal deviation present. No thyromegaly present.    Cardiovascular: Normal rate, regular rhythm, normal heart sounds and intact distal pulses.   No murmur heard. Pulmonary/Chest: Effort normal and breath sounds normal. No respiratory distress. He has no wheezes.  Abdominal: Soft. Bowel sounds are normal. There is tenderness. There is guarding.       He does have tenderness with guarding in his epigastrium and right upper quadrant. His abdomen is morbidly obese. He is also tender along his right costal margin  Musculoskeletal: Normal range of motion. He exhibits no edema and no tenderness.  Lymphadenopathy:    He has no cervical adenopathy.  Neurological: He is alert and oriented to person, place, and time.  Skin: Skin is warm. He is not diaphoretic. No erythema. No pallor.  Psychiatric: His behavior is normal. Judgment normal.    Data Reviewed I have reviewed all his x-rays and physician reports  Assessment    Right upper quadrant abdominal pain of uncertain etiology, possible biliary dyskinesia    Plan    At this point, I had a long discussion with him regarding the etiology of his discomfort. Given his exam today and history, this may very well represent biliary dyskinesia and chronic cholecystitis. We discussed doing a HIDA scan burst going ahead and proceeding with a cholecystectomy. He is here to go ahead and proceed with laparoscopic cholecystectomy which I feel is very reasonable. I discussed the risk of surgery with him which includes but is not limited to bleeding, infection, injury to surrounding structures, bile duct injury, leak, need to convert to an open procedure, and the chance this may not resolve all his symptoms or pain. He understands and wishes to proceed. Likelihood of success is moderate.       Dionna Wiedemann A 09/29/2011, 11:58 AM    

## 2011-10-07 ENCOUNTER — Encounter (HOSPITAL_COMMUNITY): Payer: Self-pay

## 2011-10-07 ENCOUNTER — Encounter (HOSPITAL_COMMUNITY): Payer: Self-pay | Admitting: Pharmacy Technician

## 2011-10-07 ENCOUNTER — Encounter (HOSPITAL_COMMUNITY)
Admission: RE | Admit: 2011-10-07 | Discharge: 2011-10-07 | Disposition: A | Discharge status: Home / Self Care | Payer: 59 | Source: Ambulatory Visit

## 2011-10-07 HISTORY — DX: Gastro-esophageal reflux disease without esophagitis: K21.9

## 2011-10-07 HISTORY — DX: Pure hypercholesterolemia, unspecified: E78.00

## 2011-10-07 LAB — CBC
Hemoglobin: 13.5 g/dL (ref 13.0–17.0)
MCHC: 33.2 g/dL (ref 30.0–36.0)
RBC: 5 MIL/uL (ref 4.22–5.81)
WBC: 7.1 10*3/uL (ref 4.0–10.5)

## 2011-10-07 LAB — COMPREHENSIVE METABOLIC PANEL
ALT: 30 U/L (ref 0–53)
AST: 23 U/L (ref 0–37)
Albumin: 4.1 g/dL (ref 3.5–5.2)
Calcium: 9.9 mg/dL (ref 8.4–10.5)
Chloride: 103 mEq/L (ref 96–112)
Creatinine, Ser: 1.09 mg/dL (ref 0.50–1.35)
Sodium: 139 mEq/L (ref 135–145)
Total Bilirubin: 0.3 mg/dL (ref 0.3–1.2)

## 2011-10-07 LAB — SURGICAL PCR SCREEN
MRSA, PCR: INVALID — AB
Staphylococcus aureus: INVALID — AB

## 2011-10-07 NOTE — Patient Instructions (Addendum)
20 PHILOPATEER STRINE  10/07/2011   Your procedure is scheduled on:  10/12/11  Report to SHORT STAY DEPT  at 9:00 AM.  Call this number if you have problems the morning of surgery: [phone]   Remember:   Do not eat food  AFTER MIDNIGHT  May have clear liquids UNTIL 6 HOURS BEFORE SURGERY (from 12:00 midnight until 5:30 am )  Clear liquids include soda, tea, black coffee, apple or grape juice, broth.  Take these medicines the morning of surgery with A SIP OF WATER: NEXIUM / TRAMADOL IF NEEDED   Do not wear jewelry, make-up or nail polish.  Do not wear lotions, powders, or perfumes. You may wear deodorant.  Do not shave legs or underarms 48 hrs. before surgery (men may shave face)  Do not bring valuables to the hospital.  Contacts, dentures or bridgework may not be worn into surgery.  Leave suitcase in the car. After surgery it may be brought to your room.  For patients admitted to the hospital, checkout time is 11:00 AM the day of discharge.   Patients discharged the day of surgery will not be allowed to drive home.  Name and phone number of your driver:   Special Instructions:   Please read over the following fact sheets that you were given: MRSA  Information               SHOWER WITH BETASEPT THE NIGHT BEFORE SURGERY AND THE MORNING OF SURGERY   BRING C-PAP MASK TO HOSPITAL

## 2011-10-08 ENCOUNTER — Telehealth (INDEPENDENT_AMBULATORY_CARE_PROVIDER_SITE_OTHER): Payer: Self-pay

## 2011-10-09 LAB — MRSA CULTURE

## 2011-10-09 NOTE — Telephone Encounter (Signed)
Close encounter 

## 2011-10-12 ENCOUNTER — Encounter (HOSPITAL_COMMUNITY): Payer: Self-pay | Admitting: *Deleted

## 2011-10-12 ENCOUNTER — Encounter (HOSPITAL_COMMUNITY)
Admission: RE | Disposition: A | Discharge status: Home / Self Care | Payer: Self-pay | Source: Ambulatory Visit | Attending: Surgery

## 2011-10-12 ENCOUNTER — Ambulatory Visit (HOSPITAL_COMMUNITY)
Admission: RE | Admit: 2011-10-12 | Discharge: 2011-10-12 | Disposition: A | Discharge status: Home / Self Care | Payer: 59 | Source: Ambulatory Visit | Attending: Surgery | Admitting: Surgery

## 2011-10-12 ENCOUNTER — Ambulatory Visit (HOSPITAL_COMMUNITY): Payer: 59 | Admitting: Anesthesiology

## 2011-10-12 ENCOUNTER — Encounter (HOSPITAL_COMMUNITY): Payer: Self-pay | Admitting: Anesthesiology

## 2011-10-12 ENCOUNTER — Encounter: Payer: Self-pay | Admitting: Internal Medicine

## 2011-10-12 DIAGNOSIS — R112 Nausea with vomiting, unspecified: Secondary | ICD-10-CM | POA: Insufficient documentation

## 2011-10-12 DIAGNOSIS — E785 Hyperlipidemia, unspecified: Secondary | ICD-10-CM | POA: Insufficient documentation

## 2011-10-12 DIAGNOSIS — R1011 Right upper quadrant pain: Secondary | ICD-10-CM | POA: Insufficient documentation

## 2011-10-12 DIAGNOSIS — K921 Melena: Secondary | ICD-10-CM | POA: Insufficient documentation

## 2011-10-12 DIAGNOSIS — R1013 Epigastric pain: Secondary | ICD-10-CM | POA: Insufficient documentation

## 2011-10-12 DIAGNOSIS — Z7982 Long term (current) use of aspirin: Secondary | ICD-10-CM | POA: Insufficient documentation

## 2011-10-12 DIAGNOSIS — G4730 Sleep apnea, unspecified: Secondary | ICD-10-CM | POA: Insufficient documentation

## 2011-10-12 DIAGNOSIS — K828 Other specified diseases of gallbladder: Secondary | ICD-10-CM | POA: Insufficient documentation

## 2011-10-12 DIAGNOSIS — K811 Chronic cholecystitis: Secondary | ICD-10-CM | POA: Insufficient documentation

## 2011-10-12 DIAGNOSIS — Z01812 Encounter for preprocedural laboratory examination: Secondary | ICD-10-CM | POA: Insufficient documentation

## 2011-10-12 DIAGNOSIS — Z79899 Other long term (current) drug therapy: Secondary | ICD-10-CM | POA: Insufficient documentation

## 2011-10-12 HISTORY — PX: CHOLECYSTECTOMY: SHX55

## 2011-10-12 SURGERY — LAPAROSCOPIC CHOLECYSTECTOMY
Anesthesia: General | Site: Abdomen | Wound class: Contaminated

## 2011-10-12 MED ORDER — SUCCINYLCHOLINE CHLORIDE 20 MG/ML IJ SOLN
INTRAMUSCULAR | Status: DC | PRN
  Administered 2011-10-12: 160 mg via INTRAVENOUS

## 2011-10-12 MED ORDER — PROMETHAZINE HCL 25 MG/ML IJ SOLN
6.2500 mg | INTRAMUSCULAR | Status: DC | PRN
  Administered 2011-10-12: 6.25 mg via INTRAVENOUS

## 2011-10-12 MED ORDER — NEOSTIGMINE METHYLSULFATE 1 MG/ML IJ SOLN
INTRAMUSCULAR | Status: DC | PRN
  Administered 2011-10-12: 5 mg via INTRAVENOUS

## 2011-10-12 MED ORDER — CISATRACURIUM BESYLATE 2 MG/ML IV SOLN
INTRAVENOUS | Status: DC | PRN
  Administered 2011-10-12: 6 mg via INTRAVENOUS

## 2011-10-12 MED ORDER — GLYCOPYRROLATE 0.2 MG/ML IJ SOLN
INTRAMUSCULAR | Status: DC | PRN
  Administered 2011-10-12: 1 mg via INTRAVENOUS

## 2011-10-12 MED ORDER — HYDROCODONE-ACETAMINOPHEN 5-325 MG PO TABS: 1.0000 | ORAL_TABLET | ORAL | Status: AC | PRN

## 2011-10-12 MED ORDER — FENTANYL CITRATE 0.05 MG/ML IJ SOLN
INTRAMUSCULAR | Status: DC | PRN
  Administered 2011-10-12: 150 ug via INTRAVENOUS

## 2011-10-12 MED ORDER — PROPOFOL 10 MG/ML IV BOLUS
INTRAVENOUS | Status: DC | PRN
  Administered 2011-10-12: 200 mg via INTRAVENOUS

## 2011-10-12 MED ORDER — LACTATED RINGERS IV SOLN
INTRAVENOUS | Status: DC | PRN
  Administered 2011-10-12: 1000 mL

## 2011-10-12 MED ORDER — CEFAZOLIN SODIUM-DEXTROSE 2-3 GM-% IV SOLR
2.0000 g | INTRAVENOUS | Status: AC
  Administered 2011-10-12: 2 g via INTRAVENOUS

## 2011-10-12 MED ORDER — FENTANYL CITRATE 0.05 MG/ML IJ SOLN
25.0000 ug | INTRAMUSCULAR | Status: DC | PRN
  Administered 2011-10-12 (×2): 25 ug via INTRAVENOUS

## 2011-10-12 MED ORDER — BUPIVACAINE-EPINEPHRINE PF 0.25-1:200000 % IJ SOLN
INTRAMUSCULAR | Status: AC
  Filled 2011-10-12: qty 30

## 2011-10-12 MED ORDER — OXYCODONE HCL 5 MG PO TABS
5.0000 mg | ORAL_TABLET | ORAL | Status: DC | PRN
Start: 2011-10-12 — End: 2011-10-12
  Administered 2011-10-12: 5 mg via ORAL

## 2011-10-12 MED ORDER — LACTATED RINGERS IV SOLN
INTRAVENOUS | Status: DC
  Administered 2011-10-12: 1000 mL via INTRAVENOUS

## 2011-10-12 MED ORDER — LACTATED RINGERS IV SOLN
INTRAVENOUS | Status: DC | PRN
  Administered 2011-10-12 (×2): via INTRAVENOUS

## 2011-10-12 MED ORDER — MIDAZOLAM HCL 5 MG/5ML IJ SOLN
INTRAMUSCULAR | Status: DC | PRN
  Administered 2011-10-12: 2 mg via INTRAVENOUS

## 2011-10-12 MED ORDER — FENTANYL CITRATE 0.05 MG/ML IJ SOLN
INTRAMUSCULAR | Status: AC
  Filled 2011-10-12: qty 2

## 2011-10-12 MED ORDER — LACTATED RINGERS IV SOLN: INTRAVENOUS | Status: DC

## 2011-10-12 MED ORDER — CEFAZOLIN SODIUM 1-5 GM-% IV SOLN
INTRAVENOUS | Status: AC
  Filled 2011-10-12: qty 100

## 2011-10-12 MED ORDER — LIDOCAINE HCL 1 % IJ SOLN
INTRAMUSCULAR | Status: DC | PRN
  Administered 2011-10-12: 100 mg via INTRADERMAL

## 2011-10-12 MED ORDER — ACETAMINOPHEN 10 MG/ML IV SOLN
INTRAVENOUS | Status: AC
  Filled 2011-10-12: qty 100

## 2011-10-12 MED ORDER — BUPIVACAINE HCL (PF) 0.5 % IJ SOLN
INTRAMUSCULAR | Status: DC | PRN
  Administered 2011-10-12: 20 mL

## 2011-10-12 MED ORDER — PROMETHAZINE HCL 25 MG/ML IJ SOLN
INTRAMUSCULAR | Status: AC
  Filled 2011-10-12: qty 1

## 2011-10-12 MED ORDER — IOHEXOL 300 MG/ML  SOLN
INTRAMUSCULAR | Status: AC
  Filled 2011-10-12: qty 1

## 2011-10-12 MED ORDER — OXYCODONE HCL 5 MG PO TABS
ORAL_TABLET | ORAL | Status: AC
  Filled 2011-10-12: qty 1

## 2011-10-12 MED ORDER — ACETAMINOPHEN 10 MG/ML IV SOLN
INTRAVENOUS | Status: DC | PRN
  Administered 2011-10-12: 1000 mg via INTRAVENOUS

## 2011-10-12 MED ORDER — ONDANSETRON HCL 4 MG/2ML IJ SOLN
INTRAMUSCULAR | Status: DC | PRN
  Administered 2011-10-12: 4 mg via INTRAVENOUS

## 2011-10-12 SURGICAL SUPPLY — 32 items
APPLIER CLIP 5 13 M/L LIGAMAX5 (MISCELLANEOUS)
BANDAGE ADHESIVE 1X3 (GAUZE/BANDAGES/DRESSINGS) ×8 IMPLANT
BENZOIN TINCTURE PRP APPL 2/3 (GAUZE/BANDAGES/DRESSINGS) ×2 IMPLANT
CANISTER SUCTION 2500CC (MISCELLANEOUS) ×2 IMPLANT
CHLORAPREP W/TINT 26ML (MISCELLANEOUS) ×2 IMPLANT
CLIP APPLIE 5 13 M/L LIGAMAX5 (MISCELLANEOUS) IMPLANT
CLOTH BEACON ORANGE TIMEOUT ST (SAFETY) ×2 IMPLANT
COVER MAYO STAND STRL (DRAPES) IMPLANT
DECANTER SPIKE VIAL GLASS SM (MISCELLANEOUS) ×2 IMPLANT
DRAPE C-ARM 42X72 X-RAY (DRAPES) IMPLANT
DRAPE LAPAROSCOPIC ABDOMINAL (DRAPES) ×2 IMPLANT
ELECT REM PT RETURN 9FT ADLT (ELECTROSURGICAL) ×2
ELECTRODE REM PT RTRN 9FT ADLT (ELECTROSURGICAL) ×1 IMPLANT
GLOVE BIOGEL PI IND STRL 7.0 (GLOVE) ×1 IMPLANT
GLOVE BIOGEL PI INDICATOR 7.0 (GLOVE) ×1
GLOVE SURG SIGNA 7.5 PF LTX (GLOVE) ×4 IMPLANT
GOWN STRL NON-REIN LRG LVL3 (GOWN DISPOSABLE) ×2 IMPLANT
GOWN STRL REIN XL XLG (GOWN DISPOSABLE) ×4 IMPLANT
HEMOSTAT SURGICEL 4X8 (HEMOSTASIS) IMPLANT
KIT BASIN OR (CUSTOM PROCEDURE TRAY) ×2 IMPLANT
NS IRRIG 1000ML POUR BTL (IV SOLUTION) IMPLANT
POUCH SPECIMEN RETRIEVAL 10MM (ENDOMECHANICALS) IMPLANT
SET CHOLANGIOGRAPH MIX (MISCELLANEOUS) IMPLANT
SET IRRIG TUBING LAPAROSCOPIC (IRRIGATION / IRRIGATOR) ×2 IMPLANT
SOLUTION ANTI FOG 6CC (MISCELLANEOUS) ×2 IMPLANT
STRIP CLOSURE SKIN 1/2X4 (GAUZE/BANDAGES/DRESSINGS) ×2 IMPLANT
SUT MNCRL AB 4-0 PS2 18 (SUTURE) ×2 IMPLANT
TOWEL OR 17X26 10 PK STRL BLUE (TOWEL DISPOSABLE) ×2 IMPLANT
TRAY LAP CHOLE (CUSTOM PROCEDURE TRAY) ×2 IMPLANT
TROCAR BLADELESS OPT 5 75 (ENDOMECHANICALS) ×6 IMPLANT
TROCAR XCEL BLUNT TIP 100MML (ENDOMECHANICALS) ×2 IMPLANT
TUBING INSUFFLATION 10FT LAP (TUBING) ×2 IMPLANT

## 2011-10-12 NOTE — Transfer of Care (Signed)
Immediate Anesthesia Transfer of Care Note  Patient: Timothy Nunez  Procedure(s) Performed: Procedure(s) (LRB): LAPAROSCOPIC CHOLECYSTECTOMY (N/A)  Patient Location: PACU  Anesthesia Type: General  Level of Consciousness: awake, alert  and oriented  Airway & Oxygen Therapy: Patient Spontanous Breathing and Patient connected to face mask oxygen  Post-op Assessment: Report given to PACU RN  Post vital signs: Reviewed and stable  Complications: No apparent anesthesia complications

## 2011-10-12 NOTE — Op Note (Signed)
Laparoscopic Cholecystectomy Procedure Note  Indications: This patient presents with symptomatic gallbladder disease and will undergo laparoscopic cholecystectomy.  Pre-operative Diagnosis: biliary dyskinesia  Post-operative Diagnosis: same  Surgeon: Abigail Miyamoto A   Assistants: 0  Anesthesia: General endotracheal anesthesia  ASA Class: 2  Procedure Details  The patient was seen again in the Holding Room. The risks, benefits, complications, treatment options, and expected outcomes were discussed with the patient. The possibilities of reaction to medication, pulmonary aspiration, perforation of viscus, bleeding, recurrent infection, finding a normal gallbladder, the need for additional procedures, failure to diagnose a condition, the possible need to convert to an open procedure, and creating a complication requiring transfusion or operation were discussed with the patient. The likelihood of improving the patient's symptoms with return to their baseline status is good.  The patient and/or family concurred with the proposed plan, giving informed consent. The site of surgery properly noted. The patient was taken to Operating Room, identified as Timothy Nunez and the procedure verified as Laparoscopic Cholecystectomy with Intraoperative Cholangiogram. A Time Out was held and the above information confirmed.  Prior to the induction of general anesthesia, antibiotic prophylaxis was administered. General endotracheal anesthesia was then administered and tolerated well. After the induction, the abdomen was prepped with Chloraprep and draped in sterile fashion. The patient was positioned in the supine position.  Local anesthetic agent was injected into the skin near the umbilicus and an incision made. We dissected down to the abdominal fascia with blunt dissection.  The fascia was incised vertically and we entered the peritoneal cavity bluntly.  A pursestring suture of 0-Vicryl was placed around  the fascial opening.  The Hasson cannula was inserted and secured with the stay suture.  Pneumoperitoneum was then created with CO2 and tolerated well without any adverse changes in the patient's vital signs. An 11-mm port was placed in the subxiphoid position.  Two 5-mm ports were placed in the right upper quadrant. All skin incisions were infiltrated with a local anesthetic agent before making the incision and placing the trocars.   We positioned the patient in reverse Trendelenburg, tilted slightly to the patient's left.  The gallbladder was identified, the fundus grasped and retracted cephalad. Adhesions were lysed bluntly and with the electrocautery where indicated, taking care not to injure any adjacent organs or viscus. The infundibulum was grasped and retracted laterally, exposing the peritoneum overlying the triangle of Calot. This was then divided and exposed in a blunt fashion. The cystic duct was clearly identified and bluntly dissected circumferentially. A critical view of the cystic duct and cystic artery was obtained.  The cystic duct was then ligated with clips and divided. The cystic artery was, dissected free, ligated with clips and divided as well.   The gallbladder was dissected from the liver bed in retrograde fashion with the electrocautery. The gallbladder was removed and placed in an Endocatch sac. The liver bed was irrigated and inspected. Hemostasis was achieved with the electrocautery. Copious irrigation was utilized and was repeatedly aspirated until clear.  The gallbladder and Endocatch sac were then removed through the umbilical port site.  The pursestring suture was used to close the umbilical fascia.    We again inspected the right upper quadrant for hemostasis.  Pneumoperitoneum was released as we removed the trocars.  4-0 Monocryl was used to close the skin.   Benzoin, steri-strips, and clean dressings were applied. The patient was then extubated and brought to the recovery  room in stable condition. Instrument, sponge, and needle  counts were correct at closure and at the conclusion of the case.   Findings: Chronically thick walled gallbladder consistent with chronic cholecystitis  Estimated Blood Loss: Minimal         Drains: 0         Specimens: Gallbladder           Complications: None; patient tolerated the procedure well.         Disposition: PACU - hemodynamically stable.         Condition: stable

## 2011-10-12 NOTE — Discharge Instructions (Signed)
[facility] ______[facility] LAPAROSCOPIC SURGERY: POST OP INSTRUCTIONS Always review your discharge instruction sheet given to you by the facility where your surgery was performed. IF YOU HAVE DISABILITY OR FAMILY LEAVE FORMS, YOU MUST BRING THEM TO THE OFFICE FOR PROCESSING.   DO NOT GIVE THEM TO YOUR DOCTOR.  1. A prescription for pain medication may be given to you upon discharge.  Take your pain medication as prescribed, if needed.  If narcotic pain medicine is not needed, then you may take acetaminophen (Tylenol) or ibuprofen (Advil) as needed. 2. Take your usually prescribed medications unless otherwise directed. 3. If you need a refill on your pain medication, please contact your pharmacy.  They will contact our office to request authorization. Prescriptions will not be filled after 5pm or on week-ends. 4. You should follow a light diet the first few days after arrival home, such as soup and crackers, etc.  Be sure to include lots of fluids daily. 5. Most patients will experience some swelling and bruising in the area of the incisions.  Ice packs will help.  Swelling and bruising can take several days to resolve.  6. It is common to experience some constipation if taking pain medication after surgery.  Increasing fluid intake and taking a stool softener (such as Colace) will usually help or prevent this problem from occurring.  A mild laxative (Milk of Magnesia or Miralax) should be taken according to package instructions if there are no bowel movements after 48 hours. 7. Unless discharge instructions indicate otherwise, you may remove your bandages 24-48 hours after surgery, and you may shower at that time.  You may have steri-strips (small skin tapes) in place directly over the incision.  These strips should be left on the skin for 7-10 days.  If your surgeon used skin glue on the incision, you may shower in 24 hours.  The glue will flake off over the next 2-3 weeks.  Any sutures or  staples will be removed at the office during your follow-up visit. 8. ACTIVITIES:  You may resume regular (light) daily activities beginning the next day--such as daily self-care, walking, climbing stairs--gradually increasing activities as tolerated.  You may have sexual intercourse when it is comfortable.  Refrain from any heavy lifting or straining until approved by your doctor. a. You may drive when you are no longer taking prescription pain medication, you can comfortably wear a seatbelt, and you can safely maneuver your car and apply brakes. b. RETURN TO WORK:  _________________________in 4 weeks_________________________________ 9. You should see your doctor in the office for a follow-up appointment approximately 2-3 weeks after your surgery.  Make sure that you call for this appointment within a day or two after you arrive home to insure a convenient appointment time. 10. OTHER INSTRUCTIONS: __________________________________________________________________________________________________________________________ __________________________________________________________________________________________________________________________ WHEN TO CALL YOUR DOCTOR: 1. Fever over 101.0 2. Inability to urinate 3. Continued bleeding from incision. 4. Increased pain, redness, or drainage from the incision. 5. Increasing abdominal pain  The clinic staff is available to answer your questions during regular business hours.  Please don't hesitate to call and ask to speak to one of the nurses for clinical concerns.  If you have a medical emergency, go to the nearest emergency room or call 911.  A surgeon from [facility] is always on call at the hospital. [address], [place], [place]  [address] ? [address] [address], [place], [place]   [address] [phone] ? [phone] ? FAX [phone]  Web site: [email or web address]

## 2011-10-12 NOTE — Addendum Note (Signed)
Addendum  created 10/12/11 1211 by Einar Pheasant, MD   Modules edited:Orders, PRL Based Order Sets

## 2011-10-12 NOTE — Interval H&P Note (Signed)
History and Physical Interval Note: he has had no change in his history or exam other than a recent ear infection now resolved 10/12/2011 8:54 AM  Timothy Nunez  has presented today for surgery, with the diagnosis of gallstones   The various methods of treatment have been discussed with the patient and family. After consideration of risks, benefits and other options for treatment, the patient has consented to  Procedure(s) (LRB): LAPAROSCOPIC CHOLECYSTECTOMY (N/A) as a surgical intervention .  The patients' history has been reviewed, patient examined, no change in status, stable for surgery.  I have reviewed the patients' chart and labs.  Questions were answered to the patient's satisfaction.     Mikalah Skyles A

## 2011-10-12 NOTE — Anesthesia Postprocedure Evaluation (Signed)
Anesthesia Post Note  Patient: Timothy Nunez  Procedure(s) Performed: Procedure(s) (LRB): LAPAROSCOPIC CHOLECYSTECTOMY (N/A)  Anesthesia type: General  Patient location: PACU  Post pain: Pain level controlled  Post assessment: Post-op Vital signs reviewed  Last Vitals:  Filed Vitals:   10/12/11 1200  BP:   Pulse: 66  Temp:   Resp: 22    Post vital signs: Reviewed  Level of consciousness: sedated  Complications: No apparent anesthesia complications

## 2011-10-12 NOTE — H&P (View-Only) (Signed)
Patient ID: Timothy Nunez, male   DOB: 10-28-1970, 41 y.o.   MRN: [id number]  Chief Complaint  Patient presents with  . Other    axillary pain    HPI Timothy Nunez is a 41 y.o. male.   HPI This is a pleasant gentleman referred by Dr. Rhea Belton for evaluation of right flank pain. The patient reports that he has pain in his right flank for approximately 9 months. It will hurt when he lays on his side. He will have nausea and vomiting with fatty meals and pain with fatty meals on occasion. He hurts almost every day. He denies fevers or chills. The pain is sharp and moderate in intensity. He has had a workup which has included a negative upper endoscopy, and negative ultrasound, and an unremarkable CAT scan of the abdomen and pelvis. Past Medical History  Diagnosis Date  . Hyperlipemia   . Sleep apnea   . Chest pain   . Abdominal pain   . Blood in stool   . Nausea vomiting and diarrhea     Past Surgical History  Procedure Date  . Foot surgery     left   . Vasectomy     Family History  Problem Relation Age of Onset  . Colon cancer Neg Hx   . Colon polyps Father   . Pancreatic cancer Paternal Grandfather   . Cancer Maternal Grandfather     colon    Social History History  Substance Use Topics  . Smoking status: Never Smoker   . Smokeless tobacco: Never Used   Comment: Around tobacco   . Alcohol Use: Yes     occ    No Known Allergies  Current Outpatient Prescriptions  Medication Sig Dispense Refill  . acetaminophen (TYLENOL) 500 MG tablet Take 500 mg by mouth every 6 (six) hours as needed. For headache      . Aspirin-Salicylamide-Caffeine (BC HEADACHE POWDER PO) Take 1 packet by mouth daily as needed. For headache      . esomeprazole (NEXIUM) 20 MG capsule Take 1 capsule (20 mg total) by mouth daily.  30 capsule  1  . Naphazoline HCl (CLEAR EYES OP) Place 1 drop into both eyes daily as needed. For dry or itchy eyes      . ondansetron (ZOFRAN) 4 MG tablet Take 1 tablet  (4 mg total) by mouth daily as needed for nausea.  30 tablet  1  . traMADol (ULTRAM) 50 MG tablet Take 1 tablet (50 mg total) by mouth every 6 (six) hours as needed for pain (if tolarable can go up 100mg ).  20 tablet  0    Review of Systems Review of Systems  Constitutional: Negative for fever, chills and unexpected weight change.  HENT: Negative for hearing loss, congestion, sore throat, trouble swallowing and voice change.   Eyes: Negative for visual disturbance.  Respiratory: Positive for apnea. Negative for cough and wheezing.   Cardiovascular: Negative for chest pain, palpitations and leg swelling.  Gastrointestinal: Positive for nausea, vomiting, abdominal pain and blood in stool. Negative for diarrhea, constipation, abdominal distention, anal bleeding and rectal pain.  Genitourinary: Negative for hematuria and difficulty urinating.  Musculoskeletal: Negative for arthralgias.  Skin: Negative for rash and wound.  Neurological: Negative for seizures, syncope, weakness and headaches.  Hematological: Negative for adenopathy. Does not bruise/bleed easily.  Psychiatric/Behavioral: Negative for confusion.    Blood pressure 144/82, pulse 71, temperature 97.8 F (36.6 C), temperature source Temporal, height 6\' 3"  (1.905 m), weight  336 lb 12.8 oz (152.771 kg), SpO2 98.00%.  Physical Exam Physical Exam  Constitutional: He is oriented to person, place, and time. He appears well-developed and well-nourished. No distress.  HENT:  Head: Normocephalic and atraumatic.  Right Ear: External ear normal.  Left Ear: External ear normal.  Nose: Nose normal.  Mouth/Throat: Oropharynx is clear and moist. No oropharyngeal exudate.  Eyes: Conjunctivae are normal. Pupils are equal, round, and reactive to light. Right eye exhibits no discharge. Left eye exhibits no discharge. No scleral icterus.  Neck: Normal range of motion. Neck supple. No tracheal deviation present. No thyromegaly present.    Cardiovascular: Normal rate, regular rhythm, normal heart sounds and intact distal pulses.   No murmur heard. Pulmonary/Chest: Effort normal and breath sounds normal. No respiratory distress. He has no wheezes.  Abdominal: Soft. Bowel sounds are normal. There is tenderness. There is guarding.       He does have tenderness with guarding in his epigastrium and right upper quadrant. His abdomen is morbidly obese. He is also tender along his right costal margin  Musculoskeletal: Normal range of motion. He exhibits no edema and no tenderness.  Lymphadenopathy:    He has no cervical adenopathy.  Neurological: He is alert and oriented to person, place, and time.  Skin: Skin is warm. He is not diaphoretic. No erythema. No pallor.  Psychiatric: His behavior is normal. Judgment normal.    Data Reviewed I have reviewed all his x-rays and physician reports  Assessment    Right upper quadrant abdominal pain of uncertain etiology, possible biliary dyskinesia    Plan    At this point, I had a long discussion with him regarding the etiology of his discomfort. Given his exam today and history, this may very well represent biliary dyskinesia and chronic cholecystitis. We discussed doing a HIDA scan burst going ahead and proceeding with a cholecystectomy. He is here to go ahead and proceed with laparoscopic cholecystectomy which I feel is very reasonable. I discussed the risk of surgery with him which includes but is not limited to bleeding, infection, injury to surrounding structures, bile duct injury, leak, need to convert to an open procedure, and the chance this may not resolve all his symptoms or pain. He understands and wishes to proceed. Likelihood of success is moderate.       Timothy Nunez A 09/29/2011, 11:58 AM

## 2011-10-12 NOTE — Anesthesia Procedure Notes (Signed)
Procedure Name: Intubation Date/Time: 10/12/2011 10:57 AM Performed by: Hulan Fess Pre-anesthesia Checklist: Patient identified, Emergency Drugs available, Suction available, Patient being monitored and Timeout performed Patient Re-evaluated:Patient Re-evaluated prior to inductionOxygen Delivery Method: Circle system utilized Preoxygenation: Pre-oxygenation with 100% oxygen Intubation Type: IV induction Ventilation: Mask ventilation without difficulty Laryngoscope Size: Mac and 4 Grade View: Grade I Tube type: Oral Number of attempts: 1 Placement Confirmation: ETT inserted through vocal cords under direct vision and breath sounds checked- equal and bilateral Secured at: 24 cm Dental Injury: Teeth and Oropharynx as per pre-operative assessment

## 2011-10-12 NOTE — Anesthesia Preprocedure Evaluation (Addendum)
Anesthesia Evaluation  Patient identified by MRN, date of birth, ID band Patient awake    Reviewed: Allergy & Precautions, H&P , NPO status , Patient's Chart, lab work & pertinent test results  Airway Mallampati: II TM Distance: >3 FB Neck ROM: Full    Dental  (+) Teeth Intact and Dental Advisory Given   Pulmonary sleep apnea and Continuous Positive Airway Pressure Ventilation ,  breath sounds clear to auscultation  Pulmonary exam normal       Cardiovascular negative cardio ROS  Rhythm:Regular Rate:Normal     Neuro/Psych negative neurological ROS  negative psych ROS   GI/Hepatic negative GI ROS, Neg liver ROS, GERD-  Medicated,  Endo/Other  negative endocrine ROSMorbid obesity  Renal/GU negative Renal ROS  negative genitourinary   Musculoskeletal negative musculoskeletal ROS (+)   Abdominal   Peds negative pediatric ROS (+)  Hematology negative hematology ROS (+)   Anesthesia Other Findings   Reproductive/Obstetrics negative OB ROS                          Anesthesia Physical Anesthesia Plan  ASA: III  Anesthesia Plan: General   Post-op Pain Management:    Induction: Intravenous  Airway Management Planned: Oral ETT  Additional Equipment:   Intra-op Plan:   Post-operative Plan: Extubation in OR  Informed Consent: I have reviewed the patients History and Physical, chart, labs and discussed the procedure including the risks, benefits and alternatives for the proposed anesthesia with the patient or authorized representative who has indicated his/her understanding and acceptance.   Dental advisory given  Plan Discussed with: CRNA  Anesthesia Plan Comments:         Anesthesia Quick Evaluation

## 2011-10-13 ENCOUNTER — Ambulatory Visit: Payer: 59 | Admitting: Internal Medicine

## 2011-10-26 ENCOUNTER — Encounter (HOSPITAL_COMMUNITY): Payer: Self-pay | Admitting: Surgery

## 2011-11-10 ENCOUNTER — Encounter (INDEPENDENT_AMBULATORY_CARE_PROVIDER_SITE_OTHER): Payer: Self-pay | Admitting: Surgery

## 2011-11-10 ENCOUNTER — Ambulatory Visit (INDEPENDENT_AMBULATORY_CARE_PROVIDER_SITE_OTHER): Payer: 59 | Admitting: Surgery

## 2011-11-10 VITALS — BP 140/80 | HR 84 | Temp 97.6°F | Ht 75.0 in | Wt 338.0 lb

## 2011-11-10 DIAGNOSIS — Z09 Encounter for follow-up examination after completed treatment for conditions other than malignant neoplasm: Secondary | ICD-10-CM

## 2011-11-10 NOTE — Progress Notes (Signed)
Subjective:     Patient ID: Timothy Nunez, male   DOB: [birth year], 41 y.o.   MRN: [id number]  HPI He is here for his first postoperative visit status post laparoscopic cholecystectomy. He reports that his discomfort is mostly resolved. He is afebrile he walks closely and is moving his bowels normally  Review of Systems     Objective:   Physical Exam His incisions are well healed. The final pathology showed chronic cholecystitis    Assessment:     Patient doing well status post laparoscopic cholecystectomy    Plan:     He may return to normal activity. I will see him back as needed

## 2011-12-08 ENCOUNTER — Telehealth (INDEPENDENT_AMBULATORY_CARE_PROVIDER_SITE_OTHER): Payer: Self-pay | Admitting: General Surgery

## 2011-12-08 NOTE — Telephone Encounter (Signed)
PT CALLED TO REPORT THAT HE HAS BEEN EXPERIENCING SOME RUQ PAIN SINCE LAST WEEK. IT HAAS HAPPENED 5 OUT OF 7 DAYS. HE HAS NOT TRIED ANY PAIN MEDICATION. HE IS EATING OK, NO NAUSEA OR VOMITING AND BOWELS ARE MOVING REGULARLY. I REVIEWED THIS WITH DR. Barrie Dunker AND HE ADVISED PT TO TAKE PAIN MEDICATION TO SEE IF THIS RELIEVES HIS SYMPTOMS AND TO SEE HIS GASTROENTEROLOGIST. I LEFT MESSAGE ON PT'S VOICEMAIL RE ADVICE.

## 2012-07-12 ENCOUNTER — Encounter (HOSPITAL_COMMUNITY): Payer: Self-pay | Admitting: Emergency Medicine

## 2012-07-12 ENCOUNTER — Emergency Department (HOSPITAL_COMMUNITY)
Admission: EM | Admit: 2012-07-12 | Discharge: 2012-07-13 | Disposition: A | Discharge status: Home / Self Care | Payer: 59 | Attending: Emergency Medicine | Admitting: Emergency Medicine

## 2012-07-12 DIAGNOSIS — R112 Nausea with vomiting, unspecified: Secondary | ICD-10-CM

## 2012-07-12 DIAGNOSIS — K219 Gastro-esophageal reflux disease without esophagitis: Secondary | ICD-10-CM | POA: Insufficient documentation

## 2012-07-12 DIAGNOSIS — E785 Hyperlipidemia, unspecified: Secondary | ICD-10-CM | POA: Insufficient documentation

## 2012-07-12 DIAGNOSIS — IMO0001 Reserved for inherently not codable concepts without codable children: Secondary | ICD-10-CM | POA: Insufficient documentation

## 2012-07-12 DIAGNOSIS — G4730 Sleep apnea, unspecified: Secondary | ICD-10-CM | POA: Insufficient documentation

## 2012-07-12 DIAGNOSIS — Z79899 Other long term (current) drug therapy: Secondary | ICD-10-CM | POA: Insufficient documentation

## 2012-07-12 DIAGNOSIS — R5383 Other fatigue: Secondary | ICD-10-CM | POA: Insufficient documentation

## 2012-07-12 DIAGNOSIS — R197 Diarrhea, unspecified: Secondary | ICD-10-CM | POA: Insufficient documentation

## 2012-07-12 DIAGNOSIS — R5381 Other malaise: Secondary | ICD-10-CM | POA: Insufficient documentation

## 2012-07-12 NOTE — ED Notes (Signed)
Pt sates he is aching all over, vomiting, diarrhea, headache, chills  Pt states his sxs started about 0500 this morning

## 2012-07-13 LAB — CBC WITH DIFFERENTIAL/PLATELET
Eosinophils Absolute: 0 10*3/uL (ref 0.0–0.7)
Hemoglobin: 15.2 g/dL (ref 13.0–17.0)
Lymphocytes Relative: 15 % (ref 12–46)
Lymphs Abs: 0.9 10*3/uL (ref 0.7–4.0)
Monocytes Relative: 4 % (ref 3–12)
Neutro Abs: 4.8 10*3/uL (ref 1.7–7.7)
Neutrophils Relative %: 81 % — ABNORMAL HIGH (ref 43–77)
Platelets: 154 10*3/uL (ref 150–400)
RBC: 5.61 MIL/uL (ref 4.22–5.81)
WBC: 5.9 10*3/uL (ref 4.0–10.5)

## 2012-07-13 LAB — POCT I-STAT, CHEM 8
Chloride: 102 mEq/L (ref 96–112)
Creatinine, Ser: 1.2 mg/dL (ref 0.50–1.35)
Glucose, Bld: 114 mg/dL — ABNORMAL HIGH (ref 70–99)
Hemoglobin: 16.7 g/dL (ref 13.0–17.0)
Potassium: 3.4 mEq/L — ABNORMAL LOW (ref 3.5–5.1)
Sodium: 140 mEq/L (ref 135–145)

## 2012-07-13 MED ORDER — ONDANSETRON HCL 4 MG PO TABS: 4.0000 mg | ORAL_TABLET | Freq: Four times a day (QID) | ORAL | Status: DC

## 2012-07-13 MED ORDER — SODIUM CHLORIDE 0.9 % IV BOLUS (SEPSIS)
1000.0000 mL | Freq: Once | INTRAVENOUS | Status: AC
  Administered 2012-07-13: 1000 mL via INTRAVENOUS

## 2012-07-13 NOTE — ED Provider Notes (Signed)
History     CSN: [id number]  Arrival date & time 07/12/12  2215   First MD Initiated Contact with Patient 07/13/12 0013      Chief Complaint  Patient presents with  . Influenza    (Consider location/radiation/quality/duration/timing/severity/associated sxs/prior treatment) HPI Comments: Patient was awakened at 5 AM with abdominal cramping followed shortly thereafter with several bouts of diarrhea than nausea and vomiting that has persisted all day now having generalized myalgia and low grade fever.  Has been sipping on Gatorade/powerade throughout the day without improvement   Patient is a 41 y.o. male presenting with flu symptoms. The history is provided by the patient.  Influenza This is a new problem. The current episode started today. The problem has been unchanged. Associated symptoms include a change in bowel habit, myalgias, nausea, vomiting and weakness. Pertinent negatives include no abdominal pain, chest pain, chills, congestion, coughing, fever, joint swelling or rash.    Past Medical History  Diagnosis Date  . Hyperlipemia   . Sleep apnea   . Chest pain   . Abdominal pain   . Blood in stool   . Nausea vomiting and diarrhea   . Elevated cholesterol   . GERD (gastroesophageal reflux disease)     Past Surgical History  Procedure Date  . Foot surgery     left   . Vasectomy   . Cholecystectomy 10/12/2011    Procedure: LAPAROSCOPIC CHOLECYSTECTOMY;  Surgeon: Shelly Rubenstein, MD;  Location: [facility];  Service: General;  Laterality: N/A;    Family History  Problem Relation Age of Onset  . Colon cancer Neg Hx   . Colon polyps Father   . Pancreatic cancer Paternal Grandfather   . Cancer Maternal Grandfather     colon    History  Substance Use Topics  . Smoking status: Never Smoker   . Smokeless tobacco: Never Used     Comment: Around tobacco   . Alcohol Use: Yes     Comment: occ      Review of Systems  Constitutional: Negative for fever and chills.   HENT: Negative for congestion.   Respiratory: Negative for cough and shortness of breath.   Cardiovascular: Negative for chest pain.  Gastrointestinal: Positive for nausea, vomiting, diarrhea and change in bowel habit. Negative for abdominal pain.  Musculoskeletal: Positive for myalgias. Negative for joint swelling.  Skin: Negative for rash and wound.  Neurological: Positive for weakness.    Allergies  Review of patient's allergies indicates no known allergies.  Home Medications   Current Outpatient Rx  Name  Route  Sig  Dispense  Refill  . ACETAMINOPHEN 500 MG PO TABS   Oral   Take 500 mg by mouth every 6 (six) hours as needed. For headache         . BC HEADACHE POWDER PO   Oral   Take 1 packet by mouth daily as needed. For headache         . ESOMEPRAZOLE MAGNESIUM 20 MG PO CPDR   Oral   Take 1 capsule (20 mg total) by mouth daily.   30 capsule   1   . IBUPROFEN 200 MG PO TABS   Oral   Take 800 mg by mouth every 6 (six) hours as needed. Pain         . CLEAR EYES OP   Both Eyes   Place 1 drop into both eyes daily as needed. For dry or itchy eyes         .  ONDANSETRON HCL 4 MG PO TABS   Oral   Take 4 mg by mouth every 8 (eight) hours as needed. Nausea         . TRAMADOL HCL 50 MG PO TABS   Oral   Take 50-100 mg by mouth every 6 (six) hours as needed. Pain         . ONDANSETRON HCL 4 MG PO TABS   Oral   Take 1 tablet (4 mg total) by mouth every 6 (six) hours.   12 tablet   0     BP 133/77  Pulse 81  Temp 99.3 F (37.4 C) (Oral)  Resp 22  SpO2 97%  Physical Exam  Constitutional: He is oriented to person, place, and time. He appears well-developed and well-nourished.  HENT:  Head: Normocephalic.  Eyes: Pupils are equal, round, and reactive to light.  Neck: Normal range of motion.  Cardiovascular: Tachycardia present.   Pulmonary/Chest: Effort normal and breath sounds normal. No respiratory distress. He exhibits no tenderness.  Abdominal:  Soft. He exhibits no distension. Bowel sounds are increased. There is no tenderness.  Musculoskeletal: Normal range of motion.  Neurological: He is alert and oriented to person, place, and time.  Skin: Skin is warm.    ED Course  Procedures (including critical care time)  Labs Reviewed  CBC WITH DIFFERENTIAL - Abnormal; Notable for the following:    Neutrophils Relative 81 (*)     All other components within normal limits  POCT I-STAT, CHEM 8 - Abnormal; Notable for the following:    Potassium 3.4 (*)     Glucose, Bld 114 (*)     All other components within normal limits   No results found.   1. Nausea vomiting and diarrhea       MDM   Patient has had no further episodes of vomiting, or diarrhea.  Since arriving in the emergency department.  He was hydrated with 1 L of normal saline and given Zofran for his symptomatic, nausea        Arman Filter, NP 07/13/12 [phone]

## 2012-07-14 NOTE — ED Provider Notes (Signed)
Medical screening examination/treatment/procedure(s) were performed by non-physician practitioner and as supervising physician I was immediately available for consultation/collaboration.  Sunnie Nielsen, MD 07/14/12 [phone]

## 2013-05-20 ENCOUNTER — Emergency Department (HOSPITAL_COMMUNITY): Payer: 59

## 2013-05-20 ENCOUNTER — Emergency Department (HOSPITAL_COMMUNITY)
Admission: EM | Admit: 2013-05-20 | Discharge: 2013-05-20 | Disposition: A | Discharge status: Home / Self Care | Payer: 59 | Attending: Emergency Medicine | Admitting: Emergency Medicine

## 2013-05-20 ENCOUNTER — Encounter (HOSPITAL_COMMUNITY): Payer: Self-pay | Admitting: Emergency Medicine

## 2013-05-20 DIAGNOSIS — S8990XA Unspecified injury of unspecified lower leg, initial encounter: Secondary | ICD-10-CM | POA: Insufficient documentation

## 2013-05-20 DIAGNOSIS — Z862 Personal history of diseases of the blood and blood-forming organs and certain disorders involving the immune mechanism: Secondary | ICD-10-CM | POA: Insufficient documentation

## 2013-05-20 DIAGNOSIS — K219 Gastro-esophageal reflux disease without esophagitis: Secondary | ICD-10-CM | POA: Insufficient documentation

## 2013-05-20 DIAGNOSIS — Y9367 Activity, basketball: Secondary | ICD-10-CM | POA: Insufficient documentation

## 2013-05-20 DIAGNOSIS — Z9889 Other specified postprocedural states: Secondary | ICD-10-CM | POA: Insufficient documentation

## 2013-05-20 DIAGNOSIS — Z79899 Other long term (current) drug therapy: Secondary | ICD-10-CM | POA: Insufficient documentation

## 2013-05-20 DIAGNOSIS — X500XXA Overexertion from strenuous movement or load, initial encounter: Secondary | ICD-10-CM | POA: Insufficient documentation

## 2013-05-20 DIAGNOSIS — M79671 Pain in right foot: Secondary | ICD-10-CM

## 2013-05-20 DIAGNOSIS — Y9239 Other specified sports and athletic area as the place of occurrence of the external cause: Secondary | ICD-10-CM | POA: Insufficient documentation

## 2013-05-20 DIAGNOSIS — Z791 Long term (current) use of non-steroidal anti-inflammatories (NSAID): Secondary | ICD-10-CM | POA: Insufficient documentation

## 2013-05-20 DIAGNOSIS — Z8639 Personal history of other endocrine, nutritional and metabolic disease: Secondary | ICD-10-CM | POA: Insufficient documentation

## 2013-05-20 MED ORDER — HYDROCODONE-ACETAMINOPHEN 5-325 MG PO TABS
2.0000 | ORAL_TABLET | Freq: Once | ORAL | Status: AC
  Administered 2013-05-20: 2 via ORAL
  Filled 2013-05-20: qty 2

## 2013-05-20 MED ORDER — IBUPROFEN 800 MG PO TABS
800.0000 mg | ORAL_TABLET | Freq: Once | ORAL | Status: AC
  Administered 2013-05-20: 800 mg via ORAL
  Filled 2013-05-20: qty 1

## 2013-05-20 MED ORDER — IBUPROFEN 800 MG PO TABS: 800.0000 mg | ORAL_TABLET | Freq: Three times a day (TID) | ORAL | Status: DC

## 2013-05-20 MED ORDER — HYDROCODONE-ACETAMINOPHEN 5-325 MG PO TABS: 1.0000 | ORAL_TABLET | Freq: Four times a day (QID) | ORAL | Status: DC | PRN

## 2013-05-20 NOTE — ED Notes (Signed)
Discharge delayed x61mins due to po narcotic medication administration .

## 2013-05-20 NOTE — ED Notes (Signed)
Pt did not receive Knee xray, unable to d/c order

## 2013-05-20 NOTE — ED Notes (Signed)
Pt from home reports that he jumped up and when he came down he felt pain in his R foot approx 1 hr PTA. Pt is unable to put pressure on foot. Pt adds that he is also having R leg pain below the knee when he tries to walk. Pt in NAD and A&O

## 2013-05-20 NOTE — ED Provider Notes (Signed)
CSN: [id number]     Arrival date & time 05/20/13  1250 History This chart was scribed for non-physician practitioner working with Gavin Pound. Oletta Lamas, MD by Ashley Jacobs, ED scribe. This patient was seen in room WTR8/WTR8 and the patient's care was started at 2:27 PM.    First MD Initiated Contact with Patient 05/20/13 1351     Chief Complaint  Patient presents with  . Foot Pain   (Consider location/radiation/quality/duration/timing/severity/associated sxs/prior Treatment) Patient is a 42 y.o. male presenting with lower extremity pain. The history is provided by the patient and medical records. No language interpreter was used.  Foot Pain The current episode started 1 to 2 hours ago. The problem occurs constantly. The problem has not changed since onset.The symptoms are aggravated by exertion, standing, bending, twisting and walking. Nothing relieves the symptoms. He has tried a cold compress for the symptoms.   HPI Comments: Timothy Nunez is a 42 y.o. male who presents to the Emergency Department complaining of right posterior, medial  malleolus injury while playing in a basketball tournament two hours ago. Pt reports when he jump and when he landed he heard a "pop" and the pain radiates to his external calf. He states Immediately after the incident he states that he was in pain and was unable to walk.  Pt denies injury to his knee or hip. He has tried elevating his right leg with no relief. Pt explains that any movement aggravates pain and nothing seems to relieve the pain. He does not have any known allergies. Pt has a medical hx of collagen vascular disease,elevated cholesterol, renal insufficiency, sickle cell anemia, and multiple myeloma. He has a surgical hx of his left foot and a laparoscopic cholecystectomy. Pt reports taking Aleve prior to playing in the tournament. 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  Pt's PCP is Dr. Willow Ora He does not smoke tobacco but he does drink alcohol  occasionally.   Past Medical History  Diagnosis Date  . Hyperlipemia   . Sleep apnea   . Chest pain   . Abdominal pain   . Blood in stool   . Nausea vomiting and diarrhea   . Elevated cholesterol   . GERD (gastroesophageal reflux disease)    Past Surgical History  Procedure Laterality Date  . Foot surgery      left   . Vasectomy    . Cholecystectomy  10/12/2011    Procedure: LAPAROSCOPIC CHOLECYSTECTOMY;  Surgeon: Shelly Rubenstein, MD;  Location: [facility];  Service: General;  Laterality: N/A;   Family History  Problem Relation Age of Onset  . Colon cancer Neg Hx   . Colon polyps Father   . Pancreatic cancer Paternal Grandfather   . Cancer Maternal Grandfather     colon   History  Substance Use Topics  . Smoking status: Never Smoker   . Smokeless tobacco: Never Used     Comment: Around tobacco   . Alcohol Use: Yes     Comment: occ    Review of Systems  Constitutional: Negative for fever and chills.  Gastrointestinal: Negative for nausea and vomiting.  Musculoskeletal: Positive for arthralgias and joint swelling. Negative for back pain, neck pain and neck stiffness.       Right foot   Skin: Negative for wound.  Neurological: Negative for numbness.  Hematological: Does not bruise/bleed easily.  Psychiatric/Behavioral: The patient is not nervous/anxious.   All other systems reviewed and are negative.    Allergies  Review of  patient's allergies indicates no known allergies.  Home Medications   Current Outpatient Rx  Name  Route  Sig  Dispense  Refill  . acetaminophen (TYLENOL) 500 MG tablet   Oral   Take 500 mg by mouth every 6 (six) hours as needed. For headache         . Aspirin-Salicylamide-Caffeine (BC HEADACHE POWDER PO)   Oral   Take 1 packet by mouth daily as needed. For headache         . esomeprazole (NEXIUM) 20 MG capsule   Oral   Take 1 capsule (20 mg total) by mouth daily.   30 capsule   1   . ibuprofen (ADVIL,MOTRIN) 200 MG tablet    Oral   Take 800 mg by mouth every 6 (six) hours as needed. Pain         . naproxen sodium (ANAPROX) 220 MG tablet   Oral   Take 220 mg by mouth 2 (two) times daily with a meal.         . HYDROcodone-acetaminophen (NORCO/VICODIN) 5-325 MG per tablet   Oral   Take 1-2 tablets by mouth every 6 (six) hours as needed for pain.   15 tablet   0   . ibuprofen (ADVIL,MOTRIN) 800 MG tablet   Oral   Take 1 tablet (800 mg total) by mouth 3 (three) times daily.   21 tablet   0    BP 133/73  Pulse 104  Temp(Src) 99 F (37.2 C) (Oral)  Resp 16  Ht 6\' 3"  (1.905 m)  Wt 330 lb (149.687 kg)  BMI 41.25 kg/m2  SpO2 95% Physical Exam  Nursing note and vitals reviewed. Constitutional: He is oriented to person, place, and time. He appears well-developed and well-nourished. No distress.  HENT:  Head: Normocephalic and atraumatic.  Right Ear: External ear normal.  Left Ear: External ear normal.  Eyes: Conjunctivae are normal. Right eye exhibits no discharge. Left eye exhibits no discharge. No scleral icterus.  Neck: Normal range of motion. Neck supple. No tracheal deviation present.  Cardiovascular: Normal rate, regular rhythm, normal heart sounds and intact distal pulses.   No murmur heard. Pulses:      Radial pulses are 2+ on the right side, and 2+ on the left side.       Dorsalis pedis pulses are 2+ on the right side, and 2+ on the left side.       Posterior tibial pulses are 2+ on the right side, and 2+ on the left side.  Capillary refill  < 3 sec  Pulmonary/Chest: Effort normal and breath sounds normal. No stridor. No respiratory distress. He has no wheezes. He has no rales.  Musculoskeletal: He exhibits edema and tenderness.  Small area of ecchymosis and tenderness to palpation on the lateral dorsal area of right foot. DP intact ROM limited in the right ankle due to pain;  full range of motion in the right knee and hip   Neurological: He is alert and oriented to person, place, and  time. He has normal strength. No sensory deficit. He exhibits normal muscle tone. He displays no seizure activity. Coordination normal.  Sensation intact to dull and sharp Strength 3/5 of the right foot and ankle secondary to pain.   Skin: Skin is warm and dry. No rash noted. He is not diaphoretic.  No tenting of the skin  Psychiatric: He has a normal mood and affect.    ED Course  Procedures (including critical care time) DIAGNOSTIC  STUDIES: Oxygen Saturation is 95% on room air, normal by my interpretation.    COORDINATION OF CARE: 2:31 PM Discussed course of care with pt post op shoe and crutches. Advised pt not to bear weight or walk on foot and to follow up with orthopedist. Pt understands and agrees.  Labs Review Labs Reviewed - No data to display Imaging Review Dg Knee 2 Views Right  05/20/2013   CLINICAL DATA:  Right lateral knee pain.  Injury.  EXAM: RIGHT KNEE - 1-2 VIEW  COMPARISON:  None.  FINDINGS: No fracture. The knee joint is normally space and aligned. Minimal marginal spurring is noted from patella medial compartment. No other arthropathic change. No joint effusion. The soft tissues are unremarkable.  IMPRESSION: No fracture or acute finding.   Electronically Signed   By: Amie Portland M.D.   On: 05/20/2013 14:10   Dg Tibia/fibula Right  05/20/2013   CLINICAL DATA:  Lateral right lower leg pain.  EXAM: RIGHT TIBIA AND FIBULA - 2 VIEW  COMPARISON:  No priors.  FINDINGS: Four views of the right tibia and fibula demonstrate no acute displaced fracture. No focal soft tissue abnormality is noted.  IMPRESSION: 1. No acute radiographic abnormality of the right tibia or fibula.   Electronically Signed   By: Trudie Reed M.D.   On: 05/20/2013 14:16   Dg Foot Complete Right  05/20/2013   CLINICAL DATA:  Right foot pain.  EXAM: RIGHT FOOT COMPLETE - 3+ VIEW  COMPARISON:  No priors.  FINDINGS: Old healed fractures of the base of the 4th and 5th metatarsals are suspected.  Multiple views of the right foot demonstrate no acute displaced fracture, subluxation, dislocation, or soft tissue abnormality. Small plantar calcaneal spur incidentally noted.  IMPRESSION: 1. No acute radiographic abnormality of the right foot. 2. Probable old healed fractures through the base of the 4th and 5th metatarsals.   Electronically Signed   By: Trudie Reed M.D.   On: 05/20/2013 14:18    EKG Interpretation   None       MDM   1. Foot pain, right      Timothy Nunez presents with right foot pain after injury.  Patient X-Ray negative for obvious fracture or dislocation as read by the radiologist. Radiology read notes probable old healed fractures through the base of the fourth and fifth metatarsals Pain managed in ED. Personal review of the films in conjunction with patient physical exam causes me to question potential acute avulsion fractures.  Will place patient in a postop shoe on crutches and nonweightbearing until he follows up with orthopedics.  Patient's pain is slightly more proximal in the fourth and fifth metatarsal, but concerning nonetheless. Pt advised to follow up with orthopedics if symptoms persist for further evaluation and diagnosis. Patient given pot-op shoe and crutches while in ED, conservative therapy recommended and discussed. Patient will be dc home & is agreeable with above plan.  It has been determined that no acute conditions requiring further emergency intervention are present at this time. The patient/guardian have been advised of the diagnosis and plan. We have discussed signs and symptoms that warrant return to the ED, such as changes or worsening in symptoms.   Vital signs are stable at discharge. Pt in significant pain at triage, RRR on physical exam.  BP 133/73  Pulse 104  Temp(Src) 99 F (37.2 C) (Oral)  Resp 16  Ht 6\' 3"  (1.905 m)  Wt 330 lb (149.687 kg)  BMI 41.25 kg/m2  SpO2 95%  Patient/guardian has voiced understanding and agreed to  follow-up with the PCP or specialist.    I personally performed the services described in this documentation, which was scribed in my presence. The recorded information has been reviewed and is accurate.    Dahlia Client Charles Andringa, PA-C 05/20/13 [phone]

## 2013-05-21 NOTE — ED Provider Notes (Signed)
Medical screening examination/treatment/procedure(s) were performed by non-physician practitioner and as supervising physician I was immediately available for consultation/collaboration.   Radley Barto Y. Daurice Ovando, MD 05/21/13 1556 

## 2013-07-19 ENCOUNTER — Encounter: Payer: Self-pay | Admitting: Sports Medicine

## 2013-07-19 ENCOUNTER — Ambulatory Visit (INDEPENDENT_AMBULATORY_CARE_PROVIDER_SITE_OTHER): Payer: 59 | Admitting: Sports Medicine

## 2013-07-19 VITALS — BP 151/92 | Ht 75.0 in | Wt 330.0 lb

## 2013-07-19 DIAGNOSIS — M722 Plantar fascial fibromatosis: Secondary | ICD-10-CM

## 2013-07-19 DIAGNOSIS — M2020 Hallux rigidus, unspecified foot: Secondary | ICD-10-CM

## 2013-07-19 DIAGNOSIS — R269 Unspecified abnormalities of gait and mobility: Secondary | ICD-10-CM | POA: Insufficient documentation

## 2013-07-19 NOTE — Progress Notes (Signed)
Patient ID: Timothy Nunez, male   DOB: [birth year], 42 y.o.   MRN: [id number]  Referred courtesy of Dr Kristeen Miss for recurrent foot pain and injury   Patient is a 42 year old black male who has a history of a left fifth metatarsal fracture that was pinned years ago when he was playing basketball. On 05/20/2013 he was playing basketball and went to block a shot. He felt a pop in the outside of his right foot. X-rays show that he had a fourth metatarsal fracture by history that was probably an aggravation of an old fracture site.  Dr. Madelon Lips placed him in a cast boot. He this for 2 months and the pain in the lateral foot resolved. After this has come off he has had a lot of medial heel pain and symptoms of plantar fasciitis with pain in the mornings. He did have a corticosteroid injection a couple weeks ago that helped but he still has moderate pain in the mornings.   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

## 2013-07-19 NOTE — Assessment & Plan Note (Signed)
Hopefully orthotics will help this.  He was given plantar fascia stretches and calf exercises

## 2013-07-19 NOTE — Assessment & Plan Note (Signed)
Patient was fitted for a standard, cushioned, semi-rigid orthotic.  The orthotic was heated and the patient stood on the orthotic blank positioned on the orthotic stand. The patient was positioned in subtalar neutral position and 10 degrees of ankle dorsiflexion in a weight bearing stance. After molding, a stable [employer] EVA base was applied to the orthotic blank.   The blank was ground to a stable position for weight bearing. Size: 15 Red EVA base: Blue EVA posting: lateral heel wedge bilaterally additional orthotic padding: first ray post  Prep time was 45 minutes  Gait was well controlled post orthotics Patient felt better cushioned with walking and standing  Advised that they should last him a couple of years although his weight might make them wear out faster

## 2013-07-19 NOTE — Assessment & Plan Note (Signed)
First ray post added to both orthotics

## 2014-01-31 ENCOUNTER — Other Ambulatory Visit: Payer: Self-pay | Admitting: Family Medicine

## 2014-01-31 DIAGNOSIS — R7989 Other specified abnormal findings of blood chemistry: Secondary | ICD-10-CM

## 2014-01-31 DIAGNOSIS — E229 Hyperfunction of pituitary gland, unspecified: Principal | ICD-10-CM

## 2014-02-08 ENCOUNTER — Ambulatory Visit
Admission: RE | Admit: 2014-02-08 | Discharge: 2014-02-08 | Disposition: A | Discharge status: Home / Self Care | Payer: 59 | Source: Ambulatory Visit | Attending: Family Medicine | Admitting: Family Medicine

## 2014-02-08 DIAGNOSIS — E229 Hyperfunction of pituitary gland, unspecified: Principal | ICD-10-CM

## 2014-02-08 DIAGNOSIS — R7989 Other specified abnormal findings of blood chemistry: Secondary | ICD-10-CM

## 2014-02-08 MED ORDER — GADOBENATE DIMEGLUMINE 529 MG/ML IV SOLN
10.0000 mL | Freq: Once | INTRAVENOUS | Status: AC | PRN
  Administered 2014-02-08: 10 mL via INTRAVENOUS

## 2014-03-01 ENCOUNTER — Telehealth: Payer: Self-pay | Admitting: Endocrinology

## 2014-03-01 NOTE — Telephone Encounter (Signed)
Patient is returning your phone call,  Please call,  Thank you

## 2014-03-01 NOTE — Telephone Encounter (Signed)
This is not our patient?  Not sure if they need to be scheduled for a new patient appointment or not?

## 2014-06-20 ENCOUNTER — Encounter: Payer: Self-pay | Admitting: Endocrinology

## 2014-06-20 ENCOUNTER — Encounter: Payer: 59 | Admitting: Endocrinology

## 2014-06-20 NOTE — Progress Notes (Signed)
This encounter was created in error - please disregard.

## 2014-06-20 NOTE — Progress Notes (Deleted)
Patient ID: Timothy Nunez, male   DOB: 28-Jul-1971, 43 y.o.   MRN: [id number]          Chief complaint:   History of Present Illness  HypogonadismWas diagnosed in   He has had complaints offatigue, decreased motivation, decreased libido, lack of energy The symptoms started    There is no history of the following: Hot flushes, sweats, breast enlargement,  long term steroid use, history of testicular injury mumps in childhood. No history of osteopenia or low impact fracture  Prior lab results show baselinetestosterone level of Prolactin level: LH level: 1.  HYPOTHYROIDISM:   2. ADRENAL insufficiency: is having no complaints of weakness, lightheadedness or nausea  3.  Growth hormone deficiency:   4.      Medication List       This list is accurate as of: 06/20/14  9:15 AM.  Always use your most recent med list.               acetaminophen 500 MG tablet  Commonly known as:  TYLENOL  Take 500 mg by mouth every 6 (six) hours as needed. For headache     BC HEADACHE POWDER PO  Take 1 packet by mouth daily as needed. For headache     esomeprazole 20 MG capsule  Commonly known as:  NEXIUM  Take 1 capsule (20 mg total) by mouth daily.     HYDROcodone-acetaminophen 5-325 MG per tablet  Commonly known as:  NORCO/VICODIN  Take 1-2 tablets by mouth every 6 (six) hours as needed for pain.     ibuprofen 200 MG tablet  Commonly known as:  ADVIL,MOTRIN  Take 800 mg by mouth every 6 (six) hours as needed. Pain     ibuprofen 800 MG tablet  Commonly known as:  ADVIL,MOTRIN  Take 1 tablet (800 mg total) by mouth 3 (three) times daily.     naproxen sodium 220 MG tablet  Commonly known as:  ANAPROX  Take 220 mg by mouth 2 (two) times daily with a meal.        Allergies: No Known Allergies  Past Medical History  Diagnosis Date  . Hyperlipemia   . Sleep apnea   . Chest pain   . Abdominal pain   . Blood in stool   . Nausea vomiting and diarrhea   .  Elevated cholesterol   . GERD (gastroesophageal reflux disease)     Past Surgical History  Procedure Laterality Date  . Foot surgery      left   . Vasectomy    . Cholecystectomy  10/12/2011    Procedure: LAPAROSCOPIC CHOLECYSTECTOMY;  Surgeon: Harl Bowie, MD;  Location: [facility];  Service: General;  Laterality: N/A;    Family History  Problem Relation Age of Onset  . Colon cancer Neg Hx   . Colon polyps Father   . Pancreatic cancer Paternal Grandfather   . Cancer Maternal Grandfather     colon    Social History:  reports that he has never smoked. He has never used smokeless tobacco. He reports that he drinks alcohol. He reports that he does not use illicit drugs.  ROS  No history of unusual headaches No history of blurred vision including peripheral vision History of hypertension: No history of abnormal fasting glucose or diabetes  General Examination:   BP 139/78 mmHg  Pulse 70  Temp(Src) 98.2 F (36.8 C)  Resp 16  Ht 6\' 3"  (1.905 m)  Wt 366 lb 6.4 oz (166.198  kg)  BMI 45.80 kg/m2  SpO2 95%  GENERAL APPEARANCE generalized obesity.  SKIN:normal, no rash or pigmentation.  HEENT:Oral mucosa normal. Normal oropharyngeal opening.  EYES:normal external appearance of eyes, Fundi benign.   NECK:no lymphadenopathy, no thyromegaly.  CHEST: Gynecomastia absent LUNGS:clear to auscultation bilaterally, no wheezes, rhonchi, rales.  HEART:normal S1 And S2, no S3, S4, murmur or click.  ABDOMEN:no hepatosplenomegaly, no masses palpated, soft and not tender.  MALE GENITOURINARY:left testicle cm and right testicle.   MUSCULOSKELETALNo enlargement or deformity of joints.  EXTREMITIES:no clubbing, no edema.  NEUROLOGIC EXAM: Biceps reflexes normal (2+) bilaterally.   Assessment/ Plan: 1. Hypogonadotropic hypogonadism   Fredrick Dray 06/20/2014, 9:15 AM

## 2014-07-19 ENCOUNTER — Other Ambulatory Visit: Payer: Self-pay | Admitting: Neurosurgery

## 2014-07-19 DIAGNOSIS — D352 Benign neoplasm of pituitary gland: Secondary | ICD-10-CM

## 2014-07-27 ENCOUNTER — Ambulatory Visit
Admission: RE | Admit: 2014-07-27 | Discharge: 2014-07-27 | Disposition: A | Discharge status: Home / Self Care | Payer: 59 | Source: Ambulatory Visit | Attending: Neurosurgery | Admitting: Neurosurgery

## 2014-07-27 DIAGNOSIS — D352 Benign neoplasm of pituitary gland: Secondary | ICD-10-CM

## 2014-07-27 MED ORDER — GADOBENATE DIMEGLUMINE 529 MG/ML IV SOLN
10.0000 mL | Freq: Once | INTRAVENOUS | Status: AC | PRN
  Administered 2014-07-27: 10 mL via INTRAVENOUS

## 2015-07-25 ENCOUNTER — Ambulatory Visit: Payer: Self-pay | Admitting: Dietician

## 2015-07-29 ENCOUNTER — Other Ambulatory Visit: Payer: Self-pay | Admitting: Endocrinology

## 2015-07-29 DIAGNOSIS — D497 Neoplasm of unspecified behavior of endocrine glands and other parts of nervous system: Secondary | ICD-10-CM

## 2015-08-22 ENCOUNTER — Other Ambulatory Visit: Payer: Self-pay

## 2015-08-30 ENCOUNTER — Ambulatory Visit
Admission: RE | Admit: 2015-08-30 | Discharge: 2015-08-30 | Disposition: A | Discharge status: Home / Self Care | Payer: 59 | Source: Ambulatory Visit | Attending: Endocrinology | Admitting: Endocrinology

## 2015-08-30 DIAGNOSIS — D497 Neoplasm of unspecified behavior of endocrine glands and other parts of nervous system: Secondary | ICD-10-CM

## 2015-09-06 ENCOUNTER — Ambulatory Visit: Admission: RE | Admit: 2015-09-06 | Payer: 59 | Source: Ambulatory Visit

## 2016-11-10 ENCOUNTER — Emergency Department (HOSPITAL_COMMUNITY): Payer: 59

## 2016-11-10 ENCOUNTER — Emergency Department (HOSPITAL_COMMUNITY)
Admission: EM | Admit: 2016-11-10 | Discharge: 2016-11-10 | Disposition: A | Discharge status: Home / Self Care | Payer: 59 | Attending: Emergency Medicine | Admitting: Emergency Medicine

## 2016-11-10 ENCOUNTER — Encounter (HOSPITAL_COMMUNITY): Payer: Self-pay | Admitting: Emergency Medicine

## 2016-11-10 DIAGNOSIS — Z79899 Other long term (current) drug therapy: Secondary | ICD-10-CM | POA: Insufficient documentation

## 2016-11-10 DIAGNOSIS — R1084 Generalized abdominal pain: Secondary | ICD-10-CM | POA: Diagnosis present

## 2016-11-10 DIAGNOSIS — Z7982 Long term (current) use of aspirin: Secondary | ICD-10-CM | POA: Insufficient documentation

## 2016-11-10 DIAGNOSIS — K529 Noninfective gastroenteritis and colitis, unspecified: Secondary | ICD-10-CM | POA: Diagnosis not present

## 2016-11-10 LAB — CBC
HEMATOCRIT: 44.4 % (ref 39.0–52.0)
HEMOGLOBIN: 15 g/dL (ref 13.0–17.0)
MCH: 27.8 pg (ref 26.0–34.0)
MCHC: 33.8 g/dL (ref 30.0–36.0)
MCV: 82.4 fL (ref 78.0–100.0)
Platelets: 177 10*3/uL (ref 150–400)
RBC: 5.39 MIL/uL (ref 4.22–5.81)
RDW: 15.6 % — ABNORMAL HIGH (ref 11.5–15.5)
WBC: 8.7 10*3/uL (ref 4.0–10.5)

## 2016-11-10 LAB — COMPREHENSIVE METABOLIC PANEL
ALT: 61 U/L (ref 17–63)
ANION GAP: 6 (ref 5–15)
AST: 40 U/L (ref 15–41)
Albumin: 4.4 g/dL (ref 3.5–5.0)
Alkaline Phosphatase: 78 U/L (ref 38–126)
BUN: 19 mg/dL (ref 6–20)
CO2: 26 mmol/L (ref 22–32)
Calcium: 9.7 mg/dL (ref 8.9–10.3)
Chloride: 106 mmol/L (ref 101–111)
Creatinine, Ser: 1.15 mg/dL (ref 0.61–1.24)
GFR calc non Af Amer: >60 mL/min (ref >60)
GLUCOSE: 119 mg/dL — AB (ref 65–99)
POTASSIUM: 4.4 mmol/L (ref 3.5–5.1)
SODIUM: 138 mmol/L (ref 135–145)
Total Bilirubin: 0.8 mg/dL (ref 0.3–1.2)
Total Protein: 8.2 g/dL — ABNORMAL HIGH (ref 6.5–8.1)

## 2016-11-10 LAB — LIPASE, BLOOD
LIPASE: 15 U/L (ref 11–51)
Lipase: 14 U/L (ref 11–51)

## 2016-11-10 MED ORDER — IOPAMIDOL (ISOVUE-300) INJECTION 61%
100.0000 mL | Freq: Once | INTRAVENOUS | Status: AC | PRN
  Administered 2016-11-10: 100 mL via INTRAVENOUS

## 2016-11-10 MED ORDER — IOPAMIDOL (ISOVUE-300) INJECTION 61%
INTRAVENOUS | Status: AC
  Filled 2016-11-10: qty 100

## 2016-11-10 MED ORDER — ONDANSETRON 4 MG PO TBDP: 4.0000 mg | ORAL_TABLET | Freq: Three times a day (TID) | ORAL | 0 refills | Status: DC | PRN

## 2016-11-10 MED ORDER — ONDANSETRON HCL 4 MG/2ML IJ SOLN
4.0000 mg | Freq: Once | INTRAMUSCULAR | Status: AC
  Administered 2016-11-10: 4 mg via INTRAVENOUS
  Filled 2016-11-10: qty 2

## 2016-11-10 MED ORDER — SODIUM CHLORIDE 0.9 % IV BOLUS (SEPSIS)
1000.0000 mL | Freq: Once | INTRAVENOUS | Status: AC
  Administered 2016-11-10: 1000 mL via INTRAVENOUS

## 2016-11-10 NOTE — ED Triage Notes (Signed)
Pt has been out of town this weekend and on his way home he had to pull over to vomit  Pt states since he has been home he has had 3-4 more episodes of vomiting and diarrhea  Pt is c/o general abd pain but states the pain is worse on the left side  Pt also states he has had some chills

## 2016-11-10 NOTE — Discharge Instructions (Signed)
Keep yourself hydrated. Use the nausea medication as prescribed. Follow up with your doctor. Return to the ED if you develop new or worsening symptoms.  °

## 2016-11-10 NOTE — ED Notes (Signed)
Patient is A & O x4.  He understood AVS instructions.  

## 2016-11-10 NOTE — ED Provider Notes (Signed)
[facility] DEPT Provider Note   CSN: [id number] Arrival date & time: 11/10/16  0142   By signing my name below, I, Timothy Nunez, attest that this documentation has been prepared under the direction and in the presence of Timothy Essex, MD. Electronically signed, Timothy Nunez, ED Scribe. 11/10/16. 4:11 AM.   History   Chief Complaint Chief Complaint  Patient presents with  . Abdominal Pain   The history is provided by the patient and medical records. No language interpreter was used.    HPI Comments: Timothy Nunez is a 46 y.o. male who presents to the Emergency Department complaining of sudden onset vomiting x 4 since last night. Pt reportedly last vomited ~12AM. He notes associated nausea; constant LLQ, LUQ and suprapubic abdominal pain; diarrhea x 4-5; hot sensation, chills and suspicion for consumption of oysters on a recent vacation. Pt's wife notes she and the pt visited [place] recently, returning 4-5 days ago. Pt given Zofran with mild relief to nausea in triage. Occasional alcohol use noted. Hx of cholecystectomy noted. Pt denies bloody stool, back pain, testicle pain, dysuria, hematuria, out of country travel, recent abx use, dizziness and Hx of ulcers, appendix removal, DM, heart disorders or lung disorders. No sick contacts noted.   Past Medical History:  Diagnosis Date  . Abdominal pain   . Blood in stool   . Chest pain   . Elevated cholesterol   . GERD (gastroesophageal reflux disease)   . Hyperlipemia   . Nausea vomiting and diarrhea   . Sleep apnea     Patient Active Problem List   Diagnosis Date Noted  . Abnormality of gait 07/19/2013  . Hallux rigidus 07/19/2013  . Plantar fasciitis of right foot 07/19/2013  . Right upper quadrant abdominal pain 09/29/2011  . GERD (gastroesophageal reflux disease) 09/03/2011  . OBESITY, MORBID 12/27/2007    Past Surgical History:  Procedure Laterality Date  . CHOLECYSTECTOMY  10/12/2011   Procedure:  LAPAROSCOPIC CHOLECYSTECTOMY;  Surgeon: Timothy Bowie, MD;  Location: [facility];  Service: General;  Laterality: N/A;  . FOOT SURGERY     left   . VASECTOMY         Home Medications    Prior to Admission medications   Medication Sig Start Date End Date Taking? Authorizing Provider  acetaminophen (TYLENOL) 500 MG tablet Take 500 mg by mouth every 6 (six) hours as needed. For headache    Historical Provider, MD  Aspirin-Salicylamide-Caffeine (BC HEADACHE POWDER PO) Take 1 packet by mouth daily as needed. For headache    Historical Provider, MD  bromocriptine (PARLODEL) 5 MG capsule  04/27/14   Historical Provider, MD  ibuprofen (ADVIL,MOTRIN) 800 MG tablet Take 1 tablet (800 mg total) by mouth 3 (three) times daily. 05/20/13   Timothy Muthersbaugh, PA-C  naproxen sodium (ANAPROX) 220 MG tablet Take 220 mg by mouth 2 (two) times daily with a meal.    Historical Provider, MD    Family History Family History  Problem Relation Age of Onset  . Colon polyps Father   . Hypertension Father   . Diabetes Father   . Hypertension Mother   . Stroke Mother   . CAD Mother   . Pancreatic cancer Paternal Grandfather   . Cancer Maternal Grandfather     colon  . Colon cancer Neg Hx     Social History Social History  Substance Use Topics  . Smoking status: Never Smoker  . Smokeless tobacco: Never Used     Comment:  Around tobacco   . Alcohol use Yes     Comment: occ     Allergies   Patient has no known allergies.   Review of Systems Review of Systems  Constitutional: Positive for chills and fatigue.  Gastrointestinal: Positive for abdominal pain, diarrhea, nausea and vomiting. Negative for blood in stool.  Genitourinary: Negative for dysuria, hematuria and testicular pain.  Musculoskeletal: Negative for back pain.  All other systems reviewed and are negative.    Physical Exam Updated Vital Signs BP (!) 151/89 (BP Location: Right Arm)   Pulse 89   Temp 98.4 F (36.9 C) (Oral)    Resp 20   SpO2 96%   Physical Exam  Constitutional: He is oriented to person, place, and time. He appears well-developed and well-nourished. No distress.  Obese  HENT:  Head: Normocephalic and atraumatic.  Mouth/Throat: Oropharynx is clear and moist. Mucous membranes are dry. No oropharyngeal exudate.  Eyes: Conjunctivae and EOM are normal. Pupils are equal, round, and reactive to light.  Neck: Normal range of motion. Neck supple.  No meningismus.  Cardiovascular: Normal rate, regular rhythm, normal heart sounds and intact distal pulses.   No murmur heard. Pulmonary/Chest: Effort normal and breath sounds normal. No respiratory distress.  Abdominal: Soft. There is tenderness in the suprapubic area, left upper quadrant and left lower quadrant. There is guarding (voluntary). There is no rebound and no CVA tenderness.  Musculoskeletal: Normal range of motion. He exhibits no edema or tenderness.  Neurological: He is alert and oriented to person, place, and time. No cranial nerve deficit. He exhibits normal muscle tone. Coordination normal.   5/5 strength throughout. CN 2-12 intact.Equal grip strength.   Skin: Skin is warm.  Psychiatric: He has a normal mood and affect. His behavior is normal.  Nursing note and vitals reviewed.  ED Treatments / Results  DIAGNOSTIC STUDIES: Oxygen Saturation is 96% on RA, normal by my interpretation.    COORDINATION OF CARE: 3:52 AM Discussed treatment plan with pt at bedside and pt agreed to plan. Fluids medications labs.  Labs (all labs ordered are listed, but only abnormal results are displayed) Labs Reviewed  COMPREHENSIVE METABOLIC PANEL - Abnormal; Notable for the following:       Result Value   Glucose, Bld 119 (*)    Total Protein 8.2 (*)    All other components within normal limits  CBC - Abnormal; Notable for the following:    RDW 15.6 (*)    All other components within normal limits  LIPASE, BLOOD  LIPASE, BLOOD  URINALYSIS, ROUTINE W  REFLEX MICROSCOPIC    EKG  EKG Interpretation None       Radiology Ct Abdomen Pelvis W Contrast  Result Date: 11/10/2016 CLINICAL DATA:  46 y/o M; left upper and lower abdominal pain, suprapubic pain, nausea, vomiting, and diarrhea. EXAM: CT ABDOMEN AND PELVIS WITH CONTRAST TECHNIQUE: Multidetector CT imaging of the abdomen and pelvis was performed using the standard protocol following bolus administration of intravenous contrast. CONTRAST:  100 cc Isovue-300 COMPARISON:  08/29/2011 CT of the abdomen and pelvis. FINDINGS: Lower chest: No acute abnormality. Hepatobiliary: No focal liver abnormality is seen. No gallstones, gallbladder wall thickening, or biliary dilatation. Hepatic steatosis. Pancreas: Unremarkable. No pancreatic ductal dilatation or surrounding inflammatory changes. Spleen: Normal in size without focal abnormality. Adrenals/Urinary Tract: Adrenal glands are unremarkable. Kidneys are normal, without renal calculi, focal lesion, or hydronephrosis. Bladder is unremarkable. Stomach/Bowel: Stomach is within normal limits. Appendix appears normal. No evidence of bowel  wall thickening, distention, or inflammatory changes. Vascular/Lymphatic: No significant vascular findings are present. No enlarged abdominal or pelvic lymph nodes. Reproductive: Prostate is unremarkable. Other: No abdominal wall hernia or abnormality. No abdominopelvic ascites. Musculoskeletal: Lumbar spondylosis with mild discogenic and lower lumbar facet arthropathy. Small osteoarthrosis of the hips and symphysis pubis. IMPRESSION: 1. No acute process identified as explanation for abdominal pain. 2. Hepatic steatosis. 3. Mild lumbar spine, hips, and symphysis pubis degenerative changes. Electronically Signed   By: Kristine Garbe M.D.   On: 11/10/2016 06:04    Procedures Procedures (including critical care time)  Medications Ordered in ED Medications - No data to display   Initial Impression / Assessment and  Plan / ED Course  I have reviewed the triage vital signs and the nursing notes.  Pertinent labs & imaging results that were available during my care of the patient were reviewed by me and considered in my medical decision making (see chart for details).    Patient with vomiting, diarrhea, upper abdominal pain since yesterday. Some chills but no fever. No sick contacts or travel out of the country. Just returned from [facility].  Obese abdomen, tender left upper quadrant with guarding. Labs are reassuring.   Patient given IV fluids and IV antiemetics. Imaging is obtained given his diffuse abdominal pain worse in the left upper quadrant. This is negative for acute process. No appendicitis.  Patient resting comfortably on reassessment. No vomiting. Abdomen is soft. Suspect viral gastroenteritis. We'll treat supportively. Follow-up with PCP. Return precautions discussed.   Final Clinical Impressions(s) / ED Diagnoses   Final diagnoses:  Gastroenteritis    New Prescriptions New Prescriptions   No medications on file   I personally performed the services described in this documentation, which was scribed in my presence. The recorded information has been reviewed and is accurate.    Timothy Essex, MD 11/10/16 [phone]

## 2017-09-04 DIAGNOSIS — S39012A Strain of muscle, fascia and tendon of lower back, initial encounter: Secondary | ICD-10-CM | POA: Diagnosis not present

## 2017-09-08 DIAGNOSIS — M545 Low back pain: Secondary | ICD-10-CM | POA: Diagnosis not present

## 2017-09-18 ENCOUNTER — Other Ambulatory Visit: Payer: Self-pay

## 2017-09-18 ENCOUNTER — Encounter (HOSPITAL_BASED_OUTPATIENT_CLINIC_OR_DEPARTMENT_OTHER): Payer: Self-pay | Admitting: Emergency Medicine

## 2017-09-18 ENCOUNTER — Emergency Department (HOSPITAL_BASED_OUTPATIENT_CLINIC_OR_DEPARTMENT_OTHER): Payer: 59

## 2017-09-18 ENCOUNTER — Emergency Department (HOSPITAL_BASED_OUTPATIENT_CLINIC_OR_DEPARTMENT_OTHER)
Admission: EM | Admit: 2017-09-18 | Discharge: 2017-09-18 | Disposition: A | Discharge status: Home / Self Care | Payer: 59 | Attending: Physician Assistant | Admitting: Physician Assistant

## 2017-09-18 DIAGNOSIS — Z79899 Other long term (current) drug therapy: Secondary | ICD-10-CM | POA: Diagnosis not present

## 2017-09-18 DIAGNOSIS — M940 Chondrocostal junction syndrome [Tietze]: Secondary | ICD-10-CM | POA: Insufficient documentation

## 2017-09-18 DIAGNOSIS — I10 Essential (primary) hypertension: Secondary | ICD-10-CM | POA: Diagnosis not present

## 2017-09-18 DIAGNOSIS — R1084 Generalized abdominal pain: Secondary | ICD-10-CM | POA: Diagnosis not present

## 2017-09-18 DIAGNOSIS — J9811 Atelectasis: Secondary | ICD-10-CM | POA: Diagnosis not present

## 2017-09-18 DIAGNOSIS — R1013 Epigastric pain: Secondary | ICD-10-CM | POA: Diagnosis present

## 2017-09-18 HISTORY — DX: Essential (primary) hypertension: I10

## 2017-09-18 LAB — URINALYSIS, ROUTINE W REFLEX MICROSCOPIC
Bilirubin Urine: NEGATIVE
GLUCOSE, UA: NEGATIVE mg/dL
HGB URINE DIPSTICK: NEGATIVE
Ketones, ur: NEGATIVE mg/dL
Leukocytes, UA: NEGATIVE
Nitrite: NEGATIVE
PROTEIN: NEGATIVE mg/dL
SPECIFIC GRAVITY, URINE: 1.025 (ref 1.005–1.030)
pH: 6 (ref 5.0–8.0)

## 2017-09-18 LAB — COMPREHENSIVE METABOLIC PANEL
ALK PHOS: 129 U/L — AB (ref 38–126)
ALT: 51 U/L (ref 17–63)
AST: 33 U/L (ref 15–41)
Albumin: 3.8 g/dL (ref 3.5–5.0)
Anion gap: 7 (ref 5–15)
BUN: 18 mg/dL (ref 6–20)
CALCIUM: 9 mg/dL (ref 8.9–10.3)
CO2: 28 mmol/L (ref 22–32)
Chloride: 105 mmol/L (ref 101–111)
Creatinine, Ser: 1.35 mg/dL — ABNORMAL HIGH (ref 0.61–1.24)
Glucose, Bld: 101 mg/dL — ABNORMAL HIGH (ref 65–99)
Potassium: 3.7 mmol/L (ref 3.5–5.1)
Sodium: 140 mmol/L (ref 135–145)
Total Bilirubin: 0.5 mg/dL (ref 0.3–1.2)
Total Protein: 7.5 g/dL (ref 6.5–8.1)

## 2017-09-18 LAB — CBC WITH DIFFERENTIAL/PLATELET
Basophils Absolute: 0 10*3/uL (ref 0.0–0.1)
Basophils Relative: 0 %
Eosinophils Absolute: 0.1 10*3/uL (ref 0.0–0.7)
Eosinophils Relative: 2 %
HCT: 40.2 % (ref 39.0–52.0)
HEMOGLOBIN: 13.4 g/dL (ref 13.0–17.0)
Lymphocytes Relative: 38 %
Lymphs Abs: 2.6 10*3/uL (ref 0.7–4.0)
MCH: 27.3 pg (ref 26.0–34.0)
MCHC: 33.3 g/dL (ref 30.0–36.0)
MCV: 81.9 fL (ref 78.0–100.0)
Monocytes Absolute: 0.5 10*3/uL (ref 0.1–1.0)
Monocytes Relative: 7 %
NEUTROS PCT: 53 %
Neutro Abs: 3.7 10*3/uL (ref 1.7–7.7)
Platelets: 201 10*3/uL (ref 150–400)
RBC: 4.91 MIL/uL (ref 4.22–5.81)
RDW: 16.2 % — ABNORMAL HIGH (ref 11.5–15.5)
WBC: 6.9 10*3/uL (ref 4.0–10.5)

## 2017-09-18 LAB — LIPASE, BLOOD: Lipase: 26 U/L (ref 11–51)

## 2017-09-18 NOTE — ED Triage Notes (Signed)
RUQ abd pain x 1 week. Reports he vomited x 1 on the 1st day of pain. Pain increases with movement. Denies urinary symptoms.

## 2017-09-18 NOTE — ED Notes (Signed)
Pt given d/c instructions as per chart. Verbalizes understanding. No questions. 

## 2017-09-18 NOTE — ED Notes (Signed)
ED Provider at bedside. 

## 2017-09-18 NOTE — ED Provider Notes (Signed)
[facility] EMERGENCY DEPARTMENT Provider Note   CSN: [id number] Arrival date & time: 09/18/17  1532     History   Chief Complaint Chief Complaint  Patient presents with  . Abdominal Pain    HPI Timothy Nunez is a 47 y.o. male.  HPI   Patient is a 48 year old male with past medical history significant for gallbladder removal, GERD.  He says that after the Super Bowl patient ate and drank a lot and then had an episode of what sounds like gastritis with epigastric pain and belching.  He reports that since then he has had occasional pain with moving.  He says he "feels like something got pulled".  When he shift to the right or left or coughs or sneezes he has shooting pain in bilateral chest walls.   No nausea no vomiting.  No chest pain.  Most of the pain is on the right side of the chest wall. Past Medical History:  Diagnosis Date  . Abdominal pain   . Blood in stool   . Chest pain   . Elevated cholesterol   . GERD (gastroesophageal reflux disease)   . Hyperlipemia   . Hypertension   . Nausea vomiting and diarrhea   . Sleep apnea     Patient Active Problem List   Diagnosis Date Noted  . Abnormality of gait 07/19/2013  . Hallux rigidus 07/19/2013  . Plantar fasciitis of right foot 07/19/2013  . Right upper quadrant abdominal pain 09/29/2011  . GERD (gastroesophageal reflux disease) 09/03/2011  . OBESITY, MORBID 12/27/2007    Past Surgical History:  Procedure Laterality Date  . CHOLECYSTECTOMY  10/12/2011   Procedure: LAPAROSCOPIC CHOLECYSTECTOMY;  Surgeon: Harl Bowie, MD;  Location: [facility];  Service: General;  Laterality: N/A;  . FOOT SURGERY     left   . VASECTOMY         Home Medications    Prior to Admission medications   Medication Sig Start Date End Date Taking? Authorizing Provider  amLODipine (NORVASC) 5 MG tablet Take 5 mg by mouth daily. 10/06/16  Yes [provider]  esomeprazole (NEXIUM) 40 MG capsule Take 40 mg by  mouth daily. 08/05/16  Yes [provider]  meloxicam (MOBIC) 15 MG tablet Take 15 mg by mouth daily. 08/05/16  Yes [provider]  terbinafine (LAMISIL) 250 MG tablet Take 250 mg by mouth daily. 10/06/16  Yes [provider]  acetaminophen (TYLENOL) 500 MG tablet Take 500 mg by mouth every 6 (six) hours as needed. For headache    [provider]  ibuprofen (ADVIL,MOTRIN) 800 MG tablet Take 1 tablet (800 mg total) by mouth 3 (three) times daily. Patient not taking: Reported on 11/10/2016 05/20/13   Muthersbaugh, Jarrett Soho, PA-C  ondansetron (ZOFRAN ODT) 4 MG disintegrating tablet Take 1 tablet (4 mg total) by mouth every 8 (eight) hours as needed for nausea or vomiting. 11/10/16   Ezequiel Essex, MD    Family History Family History  Problem Relation Age of Onset  . Colon polyps Father   . Hypertension Father   . Diabetes Father   . Hypertension Mother   . Stroke Mother   . CAD Mother   . Pancreatic cancer Paternal Grandfather   . Cancer Maternal Grandfather        colon  . Colon cancer Neg Hx     Social History Social History   Tobacco Use  . Smoking status: Never Smoker  . Smokeless tobacco: Never Used  .  Tobacco comment: Around tobacco   Substance Use Topics  . Alcohol use: Yes    Comment: occ  . Drug use: No     Allergies   Patient has no known allergies.   Review of Systems Review of Systems  Constitutional: Negative for activity change, fatigue and fever.  Respiratory: Negative for shortness of breath.   Cardiovascular: Negative for chest pain.  Gastrointestinal: Positive for abdominal pain.  All other systems reviewed and are negative.    Physical Exam Updated Vital Signs BP 134/79 (BP Location: Right Arm)   Pulse 86   Temp 98.6 F (37 C) (Oral)   Resp 18   Wt (!) 181.4 kg (400 lb)   SpO2 96%   BMI 50.00 kg/m   Physical Exam  Constitutional: He is oriented to person, place, and time. He appears well-nourished.    HENT:  Head: Normocephalic.  Eyes: Conjunctivae are normal.  Cardiovascular: Normal rate.  Pulmonary/Chest: Effort normal and breath sounds normal.  tennderness R chest wall  Abdominal: Normal appearance and bowel sounds are normal. There is tenderness in the right upper quadrant.  Neurological: He is oriented to person, place, and time.  Skin: Skin is warm and dry. He is not diaphoretic.  Psychiatric: He has a normal mood and affect. His behavior is normal.     ED Treatments / Results  Labs (all labs ordered are listed, but only abnormal results are displayed) Labs Reviewed  CBC WITH DIFFERENTIAL/PLATELET - Abnormal; Notable for the following components:      Result Value   RDW 16.2 (*)    All other components within normal limits  URINALYSIS, ROUTINE W REFLEX MICROSCOPIC  COMPREHENSIVE METABOLIC PANEL  LIPASE, BLOOD    EKG  EKG Interpretation None       Radiology No results found.  Procedures Procedures (including critical care time)  Medications Ordered in ED Medications - No data to display   Initial Impression / Assessment and Plan / ED Course  I have reviewed the triage vital signs and the nursing notes.  Pertinent labs & imaging results that were available during my care of the patient were reviewed by me and considered in my medical decision making (see chart for details).     Patient is a 47 year old male with past medical history significant for gallbladder removal, GERD.  He says that after the Super Bowl patient ate and drank a lot and then had an episode of what sounds like gastritis with epigastric pain and belching.  He reports that since then he has had occasional pain with moving.  He says he "feels like something got pulled".  When he shift to the right or left or coughs or sneezes he has shooting pain in bilateral chest walls.   No nausea no vomiting.  No chest pain.  Most of the pain is on the right side of the chest wall.  6:21 PM Sounds  very muscular in nature given that it is worse with increase intra thoracic pressure, or moving.  I suspect costochondritis.  Will get labs making sure that liver enzymes and lipase are normal.  We will get chest x-ray to make sure he does not have occult pneumonia.  Otherwise we will have him follow-up with his primary care provider.  Final Clinical Impressions(s) / ED Diagnoses   Final diagnoses:  None    ED Discharge Orders    None       Jaleeah Slight, Fredia Sorrow, MD 09/18/17 1821

## 2017-09-18 NOTE — ED Notes (Addendum)
C/o RUQ pain. States he has had his gallbladder removed. Denies Sob

## 2017-09-18 NOTE — Discharge Instructions (Signed)
You were seen today with soreness in the muscles in your chest wall.  Your labs otherwise looked reassuring as well as your vital signs.  Please return to follow-up if you have any concerns.  Otherwise please rest, use ibuprofen or Tylenol and follow-up with your primary care.

## 2017-10-02 ENCOUNTER — Emergency Department (HOSPITAL_BASED_OUTPATIENT_CLINIC_OR_DEPARTMENT_OTHER)
Admission: EM | Admit: 2017-10-02 | Discharge: 2017-10-02 | Disposition: A | Discharge status: Home / Self Care | Payer: 59 | Attending: Emergency Medicine | Admitting: Emergency Medicine

## 2017-10-02 ENCOUNTER — Emergency Department (HOSPITAL_BASED_OUTPATIENT_CLINIC_OR_DEPARTMENT_OTHER): Payer: 59

## 2017-10-02 ENCOUNTER — Encounter (HOSPITAL_BASED_OUTPATIENT_CLINIC_OR_DEPARTMENT_OTHER): Payer: Self-pay | Admitting: Emergency Medicine

## 2017-10-02 ENCOUNTER — Other Ambulatory Visit: Payer: Self-pay

## 2017-10-02 DIAGNOSIS — R69 Illness, unspecified: Secondary | ICD-10-CM

## 2017-10-02 DIAGNOSIS — J111 Influenza due to unidentified influenza virus with other respiratory manifestations: Secondary | ICD-10-CM | POA: Diagnosis not present

## 2017-10-02 DIAGNOSIS — I10 Essential (primary) hypertension: Secondary | ICD-10-CM | POA: Diagnosis not present

## 2017-10-02 DIAGNOSIS — Z79899 Other long term (current) drug therapy: Secondary | ICD-10-CM | POA: Insufficient documentation

## 2017-10-02 DIAGNOSIS — R112 Nausea with vomiting, unspecified: Secondary | ICD-10-CM | POA: Diagnosis not present

## 2017-10-02 DIAGNOSIS — R509 Fever, unspecified: Secondary | ICD-10-CM | POA: Diagnosis not present

## 2017-10-02 MED ORDER — LIDOCAINE VISCOUS 2 % MT SOLN: 15.0000 mL | OROMUCOSAL | 0 refills | Status: DC | PRN

## 2017-10-02 MED ORDER — NAPROXEN 500 MG PO TABS: 500.0000 mg | ORAL_TABLET | Freq: Two times a day (BID) | ORAL | 0 refills | Status: AC

## 2017-10-02 MED ORDER — BENZONATATE 100 MG PO CAPS: 200.0000 mg | ORAL_CAPSULE | Freq: Three times a day (TID) | ORAL | 0 refills | Status: DC

## 2017-10-02 MED ORDER — ONDANSETRON 4 MG PO TBDP: 4.0000 mg | ORAL_TABLET | Freq: Three times a day (TID) | ORAL | 0 refills | Status: DC | PRN

## 2017-10-02 MED ORDER — FLUTICASONE PROPIONATE 50 MCG/ACT NA SUSP: 1.0000 | Freq: Every day | NASAL | 2 refills | Status: DC

## 2017-10-02 MED ORDER — ACETAMINOPHEN 325 MG PO TABS
650.0000 mg | ORAL_TABLET | Freq: Once | ORAL | Status: AC
  Administered 2017-10-02: 650 mg via ORAL
  Filled 2017-10-02: qty 2

## 2017-10-02 NOTE — ED Triage Notes (Signed)
Flu like symptoms since Wed,

## 2017-10-02 NOTE — ED Provider Notes (Signed)
[facility] Provider Note   CSN: [id number] Arrival date & time: 10/02/17  0846     History   Chief Complaint Chief Complaint  Patient presents with  . Nausea    HPI Timothy Nunez is a 47 y.o. male with a past medical history of hypertension, GERD, who presents to ED for evaluation of 4-day history of influenza-like symptoms including subjective fever, body aches, productive cough, rhinorrhea, nausea and one episode of NBNB emesis.  Sick contacts at work with similar symptoms.  He does report some improvement with DayQuil taken over the course of the illness.  States that his cough is mostly dry but he has had a few episodes of "cold coming up."  He did not receive his influenza vaccine this season.  Denies any chest pain, abdominal pain, diarrhea, trouble breathing or trouble swallowing.  HPI  Past Medical History:  Diagnosis Date  . Abdominal pain   . Blood in stool   . Chest pain   . Elevated cholesterol   . GERD (gastroesophageal reflux disease)   . Hyperlipemia   . Hypertension   . Nausea vomiting and diarrhea   . Sleep apnea     Patient Active Problem List   Diagnosis Date Noted  . Abnormality of gait 07/19/2013  . Hallux rigidus 07/19/2013  . Plantar fasciitis of right foot 07/19/2013  . Right upper quadrant abdominal pain 09/29/2011  . GERD (gastroesophageal reflux disease) 09/03/2011  . OBESITY, MORBID 12/27/2007    Past Surgical History:  Procedure Laterality Date  . CHOLECYSTECTOMY  10/12/2011   Procedure: LAPAROSCOPIC CHOLECYSTECTOMY;  Surgeon: Harl Bowie, MD;  Location: [facility];  Service: General;  Laterality: N/A;  . FOOT SURGERY     left   . VASECTOMY         Home Medications    Prior to Admission medications   Medication Sig Start Date End Date Taking? Authorizing Provider  acetaminophen (TYLENOL) 500 MG tablet Take 500 mg by mouth every 6 (six) hours as needed. For headache   Yes [provider]  amLODipine (NORVASC) 5 MG tablet Take 5 mg by mouth daily. 10/06/16  Yes [provider]  esomeprazole (NEXIUM) 40 MG capsule Take 40 mg by mouth daily. 08/05/16  Yes [provider]  meloxicam (MOBIC) 15 MG tablet Take 15 mg by mouth daily. 08/05/16  Yes [provider]  terbinafine (LAMISIL) 250 MG tablet Take 250 mg by mouth daily. 10/06/16  Yes [provider]  benzonatate (TESSALON) 100 MG capsule Take 2 capsules (200 mg total) by mouth every 8 (eight) hours. 10/02/17   Rhaya Coale, PA-C  fluticasone (FLONASE) 50 MCG/ACT nasal spray Place 1 spray into both nostrils daily. 10/02/17   Emeril Stille, PA-C  ibuprofen (ADVIL,MOTRIN) 800 MG tablet Take 1 tablet (800 mg total) by mouth 3 (three) times daily. Patient not taking: Reported on 11/10/2016 05/20/13   Muthersbaugh, Jarrett Soho, PA-C  lidocaine (XYLOCAINE) 2 % solution Use as directed 15 mLs in the mouth or throat as needed for mouth pain. 10/02/17   Jess Toney, PA-C  naproxen (NAPROSYN) 500 MG tablet Take 1 tablet (500 mg total) by mouth 2 (two) times daily. 10/02/17   Osiris Charles, PA-C  ondansetron (ZOFRAN ODT) 4 MG disintegrating tablet Take 1 tablet (4 mg total) by mouth every 8 (eight) hours as needed for nausea or vomiting. 10/02/17   Delia Heady, PA-C    Family History Family History  Problem Relation Age of  Onset  . Colon polyps Father   . Hypertension Father   . Diabetes Father   . Hypertension Mother   . Stroke Mother   . CAD Mother   . Pancreatic cancer Paternal Grandfather   . Cancer Maternal Grandfather        colon  . Colon cancer Neg Hx     Social History Social History   Tobacco Use  . Smoking status: Never Smoker  . Smokeless tobacco: Never Used  . Tobacco comment: Around tobacco   Substance Use Topics  . Alcohol use: Yes    Comment: occ  . Drug use: No     Allergies   Patient has no known allergies.   Review of Systems Review of Systems  Constitutional: Positive  for fever. Negative for appetite change and chills.  HENT: Positive for congestion, rhinorrhea and sneezing. Negative for ear pain and sore throat.   Eyes: Negative for photophobia and visual disturbance.  Respiratory: Positive for cough. Negative for chest tightness, shortness of breath and wheezing.   Cardiovascular: Negative for chest pain and palpitations.  Gastrointestinal: Positive for nausea and vomiting. Negative for abdominal pain, blood in stool, constipation and diarrhea.  Genitourinary: Negative for dysuria, hematuria and urgency.  Musculoskeletal: Positive for myalgias.  Skin: Negative for rash.  Neurological: Negative for dizziness, weakness and light-headedness.     Physical Exam Updated Vital Signs BP 137/87 (BP Location: Right Arm)   Pulse 86   Temp 100.3 F (37.9 C) (Oral)   Resp 18   Ht 6\' 3"  (1.905 m)   Wt (!) 181.4 kg (400 lb)   SpO2 97%   BMI 50.00 kg/m   Physical Exam  Constitutional: He appears well-developed and well-nourished. No distress.  Nontoxic appearing and in no acute distress.  HENT:  Head: Normocephalic and atraumatic.  Right Ear: A middle ear effusion is present.  Left Ear: A middle ear effusion is present.  Nose: Rhinorrhea present.  Mouth/Throat: Uvula is midline. No trismus in the jaw. No uvula swelling. No posterior oropharyngeal edema or posterior oropharyngeal erythema. No tonsillar exudate.  Eyes: Conjunctivae and EOM are normal. Left eye exhibits no discharge. No scleral icterus.  Neck: Normal range of motion. Neck supple.  Cardiovascular: Normal rate, regular rhythm, normal heart sounds and intact distal pulses. Exam reveals no gallop and no friction rub.  No murmur heard. Pulmonary/Chest: Effort normal and breath sounds normal. No respiratory distress.  Abdominal: Soft. Bowel sounds are normal. He exhibits no distension. There is no tenderness. There is no guarding.  Musculoskeletal: Normal range of motion. He exhibits no edema.    Neurological: He is alert. He exhibits normal muscle tone. Coordination normal.  Skin: Skin is warm and dry. No rash noted.  Psychiatric: He has a normal mood and affect.  Nursing note and vitals reviewed.    ED Treatments / Results  Labs (all labs ordered are listed, but only abnormal results are displayed) Labs Reviewed - No data to display  EKG  EKG Interpretation None       Radiology Dg Chest 2 View  Result Date: 10/02/2017 CLINICAL DATA:  Flu like symptoms EXAM: CHEST  2 VIEW COMPARISON:  September 18, 2017 FINDINGS: The heart size and mediastinal contours are within normal limits. Both lungs are clear. The visualized skeletal structures are unremarkable. IMPRESSION: No active cardiopulmonary disease. Electronically Signed   By: Dorise Bullion III M.D   On: 10/02/2017 09:47    Procedures Procedures (including critical care time)  Medications Ordered in ED Medications  acetaminophen (TYLENOL) tablet 650 mg (650 mg Oral Given 10/02/17 0946)     Initial Impression / Assessment and Plan / ED Course  I have reviewed the triage vital signs and the nursing notes.  Pertinent labs & imaging results that were available during my care of the patient were reviewed by me and considered in my medical decision making (see chart for details).     Patient presents to ED for evaluation of 4-day history of influenza-like symptoms including subjective fever, body aches, productive cough, rhinorrhea, nausea.  Sick contacts at work with similar symptoms.  On physical exam he is overall well-appearing.  Febrile to 100.3 here in the ED but other vital signs are within normal limits.  He is satting at 97-98% on room air.  Chest x-ray returned as negative.  He is out of the window for Tamiflu administration for what appears to be influenza-like illness.  Will treat symptomatically with antiemetics, antitussives, nasal spray, anti-inflammatories and lidocaine solution to swish and spit for throat  discomfort.  At this time I do not believe that there is a bacterial cause or signs of RPA or PTA on examination of posterior oropharynx.  Patient appears stable for discharge at this time.  Strict return precautions given.  Portions of this note were generated with [employer]. Dictation errors may occur despite best attempts at proofreading.   Final Clinical Impressions(s) / ED Diagnoses   Final diagnoses:  Influenza-like illness    ED Discharge Orders        Ordered    benzonatate (TESSALON) 100 MG capsule  Every 8 hours     10/02/17 0954    ondansetron (ZOFRAN ODT) 4 MG disintegrating tablet  Every 8 hours PRN     10/02/17 0954    fluticasone (FLONASE) 50 MCG/ACT nasal spray  Daily     10/02/17 0954    naproxen (NAPROSYN) 500 MG tablet  2 times daily     10/02/17 0954    lidocaine (XYLOCAINE) 2 % solution  As needed     10/02/17 0954       Delia Heady, PA-C 10/02/17 6644    Charlesetta Shanks, MD 10/02/17 1511

## 2017-10-02 NOTE — Discharge Instructions (Signed)
Your chest x-ray showed no acute abnormality process. Your symptoms appear consistent with influenza-like illness. Take the following medications to help with your symptoms: Tessalon Perles as needed for cough, Zofran as needed for nausea, naproxen as needed for body aches, Flonase as needed for nasal congestion, lidocaine solution to swish and spit for throat discomfort and drainage. Follow-up with your primary care provider for further evaluation if symptoms persist. Continue your other home medications as previously prescribed. You will be contagious as long as you have a fever. Return to ED for worsening symptoms including chest pain, severe abdominal pain, increased vomiting, lightheadedness or loss of consciousness.

## 2017-12-03 DIAGNOSIS — E785 Hyperlipidemia, unspecified: Secondary | ICD-10-CM | POA: Diagnosis not present

## 2017-12-03 DIAGNOSIS — I10 Essential (primary) hypertension: Secondary | ICD-10-CM | POA: Diagnosis not present

## 2018-09-23 DIAGNOSIS — Z0000 Encounter for general adult medical examination without abnormal findings: Secondary | ICD-10-CM | POA: Diagnosis not present

## 2018-09-23 DIAGNOSIS — Z131 Encounter for screening for diabetes mellitus: Secondary | ICD-10-CM | POA: Diagnosis not present

## 2018-09-23 DIAGNOSIS — R0602 Shortness of breath: Secondary | ICD-10-CM | POA: Diagnosis not present

## 2018-09-23 DIAGNOSIS — Z13220 Encounter for screening for lipoid disorders: Secondary | ICD-10-CM | POA: Diagnosis not present

## 2018-09-23 DIAGNOSIS — E559 Vitamin D deficiency, unspecified: Secondary | ICD-10-CM | POA: Diagnosis not present

## 2018-09-23 DIAGNOSIS — R5383 Other fatigue: Secondary | ICD-10-CM | POA: Diagnosis not present

## 2018-10-05 DIAGNOSIS — R0602 Shortness of breath: Secondary | ICD-10-CM | POA: Diagnosis not present

## 2018-10-05 DIAGNOSIS — R9431 Abnormal electrocardiogram [ECG] [EKG]: Secondary | ICD-10-CM | POA: Diagnosis not present

## 2018-10-06 DIAGNOSIS — I440 Atrioventricular block, first degree: Secondary | ICD-10-CM | POA: Diagnosis not present

## 2018-10-07 DIAGNOSIS — R5383 Other fatigue: Secondary | ICD-10-CM | POA: Diagnosis not present

## 2018-10-07 DIAGNOSIS — E559 Vitamin D deficiency, unspecified: Secondary | ICD-10-CM | POA: Diagnosis not present

## 2018-10-07 DIAGNOSIS — R7303 Prediabetes: Secondary | ICD-10-CM | POA: Diagnosis not present

## 2018-10-20 DIAGNOSIS — M1712 Unilateral primary osteoarthritis, left knee: Secondary | ICD-10-CM | POA: Diagnosis not present

## 2018-10-20 DIAGNOSIS — M25562 Pain in left knee: Secondary | ICD-10-CM | POA: Diagnosis not present

## 2018-10-26 ENCOUNTER — Other Ambulatory Visit: Payer: Self-pay | Admitting: Family Medicine

## 2018-10-26 DIAGNOSIS — M25562 Pain in left knee: Principal | ICD-10-CM

## 2018-10-26 DIAGNOSIS — G8929 Other chronic pain: Secondary | ICD-10-CM

## 2018-11-07 ENCOUNTER — Other Ambulatory Visit: Payer: 59

## 2018-12-28 ENCOUNTER — Other Ambulatory Visit: Payer: 59

## 2019-01-12 ENCOUNTER — Other Ambulatory Visit: Payer: 59

## 2019-01-27 ENCOUNTER — Ambulatory Visit
Admission: RE | Admit: 2019-01-27 | Discharge: 2019-01-27 | Disposition: A | Discharge status: Home / Self Care | Payer: 59 | Source: Ambulatory Visit | Attending: Family Medicine | Admitting: Family Medicine

## 2019-01-27 ENCOUNTER — Other Ambulatory Visit: Payer: Self-pay

## 2019-01-27 DIAGNOSIS — G8929 Other chronic pain: Secondary | ICD-10-CM

## 2019-03-16 ENCOUNTER — Other Ambulatory Visit: Payer: Self-pay

## 2019-03-16 ENCOUNTER — Encounter (HOSPITAL_BASED_OUTPATIENT_CLINIC_OR_DEPARTMENT_OTHER): Payer: Self-pay | Admitting: *Deleted

## 2019-03-16 NOTE — Progress Notes (Signed)
Chart given to [place], [place] for anesthesia review for sleep apnea, HTN and BMI 48.12.

## 2019-03-16 NOTE — Progress Notes (Signed)
Spoke with patient for pre op interview via telephone. NPO after MN. Patient to take Nexium and Norvasc with a sip of water AM of surgery. Patient does have sleep apnea but does not wear a mask. Arrival time 1230.

## 2019-03-17 ENCOUNTER — Encounter (HOSPITAL_BASED_OUTPATIENT_CLINIC_OR_DEPARTMENT_OTHER): Payer: Self-pay | Admitting: *Deleted

## 2019-03-17 NOTE — Progress Notes (Signed)
Called and spoke w/ pt via phone inquiring about chest pain in health history.  Noted in epic ED visit @ [facility] 10-29-2010 for chest pain,  Negative CT angio and Troponin/CK negative , final dx chest pain unclear etiology and pt given robaxin.  Per pt has not had any cardiac s&s since that time.  But has had some type of stress test done 15 yrs ago or more for work and told normal.  Pt states is pcp is dr Raynelle Dick jones @ [facility] on [address]. Called and spoke w/ medical records @ [facility] they have a echo fut any records from before with dr Ronnald Ramp they would not have because dr Ronnald Ramp joined the group not to long ago.  Received faxed echo dated 10-05-2018, placed in chart.  Echo showed moderate conentric LVH,  EF 60-65%,  And mild LAE.  Spoke w Konrad Felix PA whom had spoke with dr Fransisco Beau mda,  Stated ok for pt to proceed with surgery.

## 2019-03-18 ENCOUNTER — Other Ambulatory Visit (HOSPITAL_COMMUNITY)
Admission: RE | Admit: 2019-03-18 | Discharge: 2019-03-18 | Disposition: A | Discharge status: Home / Self Care | Payer: 59 | Source: Ambulatory Visit | Attending: Orthopedic Surgery | Admitting: Orthopedic Surgery

## 2019-03-18 DIAGNOSIS — Z20828 Contact with and (suspected) exposure to other viral communicable diseases: Secondary | ICD-10-CM | POA: Insufficient documentation

## 2019-03-18 DIAGNOSIS — Z01812 Encounter for preprocedural laboratory examination: Secondary | ICD-10-CM | POA: Insufficient documentation

## 2019-03-18 DIAGNOSIS — S83242A Other tear of medial meniscus, current injury, left knee, initial encounter: Secondary | ICD-10-CM | POA: Diagnosis not present

## 2019-03-19 LAB — SARS CORONAVIRUS 2 (TAT 6-24 HRS): SARS Coronavirus 2: NEGATIVE

## 2019-03-21 ENCOUNTER — Other Ambulatory Visit: Payer: Self-pay | Admitting: Orthopedic Surgery

## 2019-03-22 ENCOUNTER — Other Ambulatory Visit: Payer: Self-pay

## 2019-03-22 ENCOUNTER — Encounter (HOSPITAL_BASED_OUTPATIENT_CLINIC_OR_DEPARTMENT_OTHER)
Admission: RE | Disposition: A | Discharge status: Home / Self Care | Payer: Self-pay | Source: Home / Self Care | Attending: Orthopedic Surgery

## 2019-03-22 ENCOUNTER — Ambulatory Visit (HOSPITAL_BASED_OUTPATIENT_CLINIC_OR_DEPARTMENT_OTHER): Payer: 59 | Admitting: Physician Assistant

## 2019-03-22 ENCOUNTER — Encounter (HOSPITAL_BASED_OUTPATIENT_CLINIC_OR_DEPARTMENT_OTHER): Payer: Self-pay | Admitting: Certified Registered Nurse Anesthetist

## 2019-03-22 ENCOUNTER — Ambulatory Visit (HOSPITAL_COMMUNITY)
Admission: RE | Admit: 2019-03-22 | Discharge: 2019-03-22 | Disposition: A | Discharge status: Home / Self Care | Payer: 59 | Attending: Orthopedic Surgery | Admitting: Orthopedic Surgery

## 2019-03-22 DIAGNOSIS — Z79899 Other long term (current) drug therapy: Secondary | ICD-10-CM | POA: Insufficient documentation

## 2019-03-22 DIAGNOSIS — S83282A Other tear of lateral meniscus, current injury, left knee, initial encounter: Secondary | ICD-10-CM | POA: Diagnosis present

## 2019-03-22 DIAGNOSIS — Z6841 Body Mass Index (BMI) 40.0 and over, adult: Secondary | ICD-10-CM | POA: Insufficient documentation

## 2019-03-22 DIAGNOSIS — E7800 Pure hypercholesterolemia, unspecified: Secondary | ICD-10-CM | POA: Diagnosis not present

## 2019-03-22 DIAGNOSIS — K219 Gastro-esophageal reflux disease without esophagitis: Secondary | ICD-10-CM | POA: Diagnosis not present

## 2019-03-22 DIAGNOSIS — I10 Essential (primary) hypertension: Secondary | ICD-10-CM | POA: Insufficient documentation

## 2019-03-22 DIAGNOSIS — X58XXXA Exposure to other specified factors, initial encounter: Secondary | ICD-10-CM | POA: Insufficient documentation

## 2019-03-22 DIAGNOSIS — S83242A Other tear of medial meniscus, current injury, left knee, initial encounter: Secondary | ICD-10-CM | POA: Diagnosis not present

## 2019-03-22 DIAGNOSIS — Z791 Long term (current) use of non-steroidal anti-inflammatories (NSAID): Secondary | ICD-10-CM | POA: Diagnosis not present

## 2019-03-22 DIAGNOSIS — E785 Hyperlipidemia, unspecified: Secondary | ICD-10-CM | POA: Diagnosis not present

## 2019-03-22 DIAGNOSIS — G4730 Sleep apnea, unspecified: Secondary | ICD-10-CM | POA: Diagnosis not present

## 2019-03-22 DIAGNOSIS — M6752 Plica syndrome, left knee: Secondary | ICD-10-CM | POA: Diagnosis present

## 2019-03-22 HISTORY — DX: Personal history of other specified conditions: Z87.898

## 2019-03-22 HISTORY — DX: Personal history of other medical treatment: Z92.89

## 2019-03-22 HISTORY — PX: KNEE ARTHROSCOPY: SHX127

## 2019-03-22 LAB — POCT I-STAT 4, (NA,K, GLUC, HGB,HCT)
Glucose, Bld: 80 mg/dL (ref 70–99)
HCT: 47 % (ref 39.0–52.0)
Hemoglobin: 16 g/dL (ref 13.0–17.0)
Potassium: 3.7 mmol/L (ref 3.5–5.1)
Sodium: 142 mmol/L (ref 135–145)

## 2019-03-22 SURGERY — ARTHROSCOPY, KNEE
Anesthesia: General | Site: Knee | Laterality: Left

## 2019-03-22 MED ORDER — CEFAZOLIN SODIUM-DEXTROSE 2-4 GM/100ML-% IV SOLN
2.0000 g | INTRAVENOUS | Status: DC
  Filled 2019-03-22: qty 100

## 2019-03-22 MED ORDER — FENTANYL CITRATE (PF) 100 MCG/2ML IJ SOLN
INTRAMUSCULAR | Status: AC
  Filled 2019-03-22: qty 2

## 2019-03-22 MED ORDER — CEFAZOLIN SODIUM-DEXTROSE 1-4 GM/50ML-% IV SOLN
INTRAVENOUS | Status: AC
  Filled 2019-03-22: qty 50

## 2019-03-22 MED ORDER — PROPOFOL 10 MG/ML IV BOLUS
INTRAVENOUS | Status: DC | PRN
  Administered 2019-03-22: 300 mg via INTRAVENOUS

## 2019-03-22 MED ORDER — ONDANSETRON HCL 4 MG/2ML IJ SOLN
INTRAMUSCULAR | Status: DC | PRN
  Administered 2019-03-22: 4 mg via INTRAVENOUS

## 2019-03-22 MED ORDER — MIDAZOLAM HCL 5 MG/5ML IJ SOLN
INTRAMUSCULAR | Status: DC | PRN
  Administered 2019-03-22: 2 mg via INTRAVENOUS

## 2019-03-22 MED ORDER — EPINEPHRINE PF 1 MG/ML IJ SOLN
INTRAMUSCULAR | Status: DC | PRN
  Administered 2019-03-22: 1 mg

## 2019-03-22 MED ORDER — DEXAMETHASONE SODIUM PHOSPHATE 10 MG/ML IJ SOLN
INTRAMUSCULAR | Status: DC | PRN
  Administered 2019-03-22: 10 mg via INTRAVENOUS

## 2019-03-22 MED ORDER — LIDOCAINE 2% (20 MG/ML) 5 ML SYRINGE
INTRAMUSCULAR | Status: AC
  Filled 2019-03-22: qty 5

## 2019-03-22 MED ORDER — DEXAMETHASONE SODIUM PHOSPHATE 10 MG/ML IJ SOLN
INTRAMUSCULAR | Status: AC
  Filled 2019-03-22: qty 1

## 2019-03-22 MED ORDER — ONDANSETRON HCL 4 MG/2ML IJ SOLN
INTRAMUSCULAR | Status: AC
  Filled 2019-03-22: qty 2

## 2019-03-22 MED ORDER — ONDANSETRON HCL 4 MG/2ML IJ SOLN
4.0000 mg | Freq: Once | INTRAMUSCULAR | Status: DC | PRN
  Filled 2019-03-22: qty 2

## 2019-03-22 MED ORDER — FENTANYL CITRATE (PF) 100 MCG/2ML IJ SOLN
INTRAMUSCULAR | Status: DC | PRN
  Administered 2019-03-22: 100 ug via INTRAVENOUS
  Administered 2019-03-22 (×2): 50 ug via INTRAVENOUS

## 2019-03-22 MED ORDER — FENTANYL CITRATE (PF) 100 MCG/2ML IJ SOLN
25.0000 ug | INTRAMUSCULAR | Status: DC | PRN
  Administered 2019-03-22: 25 ug via INTRAVENOUS
  Filled 2019-03-22: qty 1

## 2019-03-22 MED ORDER — DEXTROSE 5 % IV SOLN
INTRAVENOUS | Status: DC | PRN
  Administered 2019-03-22: 15:00:00 3 g via INTRAVENOUS

## 2019-03-22 MED ORDER — PROPOFOL 10 MG/ML IV BOLUS
INTRAVENOUS | Status: AC
  Filled 2019-03-22: qty 40

## 2019-03-22 MED ORDER — ACETAMINOPHEN 325 MG PO TABS
325.0000 mg | ORAL_TABLET | ORAL | Status: DC | PRN
  Filled 2019-03-22: qty 2

## 2019-03-22 MED ORDER — CHLORHEXIDINE GLUCONATE 4 % EX LIQD
60.0000 mL | Freq: Once | CUTANEOUS | Status: DC
  Filled 2019-03-22: qty 118

## 2019-03-22 MED ORDER — LACTATED RINGERS IV SOLN
INTRAVENOUS | Status: DC
  Administered 2019-03-22 (×2): via INTRAVENOUS
  Filled 2019-03-22: qty 1000

## 2019-03-22 MED ORDER — CEFAZOLIN SODIUM-DEXTROSE 2-4 GM/100ML-% IV SOLN
INTRAVENOUS | Status: AC
  Filled 2019-03-22: qty 100

## 2019-03-22 MED ORDER — OXYCODONE HCL 5 MG/5ML PO SOLN
5.0000 mg | Freq: Once | ORAL | Status: DC | PRN
  Filled 2019-03-22: qty 5

## 2019-03-22 MED ORDER — OXYCODONE HCL 5 MG PO TABS
5.0000 mg | ORAL_TABLET | Freq: Once | ORAL | Status: DC | PRN
  Filled 2019-03-22: qty 1

## 2019-03-22 MED ORDER — LIDOCAINE 2% (20 MG/ML) 5 ML SYRINGE
INTRAMUSCULAR | Status: DC | PRN
  Administered 2019-03-22: 100 mg via INTRAVENOUS

## 2019-03-22 MED ORDER — KETOROLAC TROMETHAMINE 30 MG/ML IJ SOLN
30.0000 mg | Freq: Once | INTRAMUSCULAR | Status: DC | PRN
  Filled 2019-03-22: qty 1

## 2019-03-22 MED ORDER — MEPERIDINE HCL 25 MG/ML IJ SOLN
6.2500 mg | INTRAMUSCULAR | Status: DC | PRN
  Filled 2019-03-22: qty 1

## 2019-03-22 MED ORDER — ACETAMINOPHEN 160 MG/5ML PO SOLN
325.0000 mg | ORAL | Status: DC | PRN
  Filled 2019-03-22: qty 20.3

## 2019-03-22 MED ORDER — OXYCODONE-ACETAMINOPHEN 5-325 MG PO TABS: 1.0000 | ORAL_TABLET | Freq: Four times a day (QID) | ORAL | 0 refills | Status: AC | PRN

## 2019-03-22 MED ORDER — BUPIVACAINE HCL (PF) 0.25 % IJ SOLN
INTRAMUSCULAR | Status: DC | PRN
  Administered 2019-03-22: 20 mL

## 2019-03-22 MED ORDER — MIDAZOLAM HCL 2 MG/2ML IJ SOLN
INTRAMUSCULAR | Status: AC
  Filled 2019-03-22: qty 2

## 2019-03-22 SURGICAL SUPPLY — 51 items
BANDAGE ELASTIC 6 VELCRO ST LF (GAUZE/BANDAGES/DRESSINGS) ×3 IMPLANT
BLADE 4.2CUDA (BLADE) ×3 IMPLANT
BLADE EXCALIBUR 4.0MM X 13CM (MISCELLANEOUS) ×1
BLADE EXCALIBUR 4.0X13 (MISCELLANEOUS) ×2 IMPLANT
BLADE GREAT WHITE 4.2 (BLADE) IMPLANT
BLADE GREAT WHITE 4.2MM (BLADE)
BNDG ELASTIC 6X5.8 VLCR STR LF (GAUZE/BANDAGES/DRESSINGS) ×3 IMPLANT
BOOTIES KNEE HIGH SLOAN (MISCELLANEOUS) ×6 IMPLANT
CANISTER SUCT 3000ML PPV (MISCELLANEOUS) IMPLANT
CANISTER SUCTION 1200CC (MISCELLANEOUS) IMPLANT
CLOTH BEACON ORANGE TIMEOUT ST (SAFETY) ×3 IMPLANT
COVER WAND RF STERILE (DRAPES) ×3 IMPLANT
CUTTER MENISCUS  4.2MM (BLADE)
CUTTER MENISCUS 4.2MM (BLADE) IMPLANT
DRAPE ARTHROSCOPY W/POUCH 114 (DRAPES) ×3 IMPLANT
DRSG EMULSION OIL 3X3 NADH (GAUZE/BANDAGES/DRESSINGS) ×3 IMPLANT
DRSG PAD ABDOMINAL 8X10 ST (GAUZE/BANDAGES/DRESSINGS) ×3 IMPLANT
DURAPREP 26ML APPLICATOR (WOUND CARE) ×3 IMPLANT
ELECT MENISCUS 165MM 90D (ELECTRODE) IMPLANT
ELECT REM PT RETURN 9FT ADLT (ELECTROSURGICAL)
ELECTRODE REM PT RTRN 9FT ADLT (ELECTROSURGICAL) IMPLANT
GAUZE SPONGE 4X4 12PLY STRL (GAUZE/BANDAGES/DRESSINGS) ×3 IMPLANT
GAUZE SPONGE 4X4 12PLY STRL LF (GAUZE/BANDAGES/DRESSINGS) ×3 IMPLANT
GLOVE ECLIPSE 7.5 STRL STRAW (GLOVE) ×6 IMPLANT
GLOVE INDICATOR 8.0 STRL GRN (GLOVE) ×6 IMPLANT
GOWN STRL REUS W/TWL LRG LVL3 (GOWN DISPOSABLE) ×3 IMPLANT
GOWN STRL REUS W/TWL XL LVL3 (GOWN DISPOSABLE) ×3 IMPLANT
IV NS IRRIG 3000ML ARTHROMATIC (IV SOLUTION) ×6 IMPLANT
KIT TURNOVER CYSTO (KITS) ×3 IMPLANT
KNEE WRAP E Z 3 GEL PACK (MISCELLANEOUS) ×3 IMPLANT
MANIFOLD NEPTUNE II (INSTRUMENTS) ×3 IMPLANT
NDL SAFETY ECLIPSE 18X1.5 (NEEDLE) IMPLANT
NEEDLE FILTER BLUNT 18X 1/2SAF (NEEDLE) ×2
NEEDLE FILTER BLUNT 18X1 1/2 (NEEDLE) ×1 IMPLANT
NEEDLE HYPO 18GX1.5 SHARP (NEEDLE)
NS IRRIG 500ML POUR BTL (IV SOLUTION) ×3 IMPLANT
PACK ARTHROSCOPY DSU (CUSTOM PROCEDURE TRAY) ×3 IMPLANT
PACK BASIN DAY SURGERY FS (CUSTOM PROCEDURE TRAY) ×3 IMPLANT
PAD CAST 4YDX4 CTTN HI CHSV (CAST SUPPLIES) ×1 IMPLANT
PADDING CAST COTTON 4X4 STRL (CAST SUPPLIES) ×2
PROBE BIPOLAR ATHRO 135MM 90D (MISCELLANEOUS) IMPLANT
SET ARTHROSCOPY TUBING (MISCELLANEOUS) ×2
SET ARTHROSCOPY TUBING LN (MISCELLANEOUS) ×1 IMPLANT
STOCKINETTE IMPERVIOUS LG (DRAPES) ×3 IMPLANT
SUT ETHILON 4 0 PS 2 18 (SUTURE) IMPLANT
SYR 10ML LL (SYRINGE) ×3 IMPLANT
TOWEL OR 17X26 10 PK STRL BLUE (TOWEL DISPOSABLE) ×3 IMPLANT
TUBE CONNECTING 12'X1/4 (SUCTIONS) ×1
TUBE CONNECTING 12X1/4 (SUCTIONS) ×2 IMPLANT
WATER STERILE IRR 500ML POUR (IV SOLUTION) IMPLANT
WRAP KNEE MAXI GEL POST OP (GAUZE/BANDAGES/DRESSINGS) IMPLANT

## 2019-03-22 NOTE — Interval H&P Note (Signed)
History and Physical Interval Note:  03/22/2019 3:07 PM  Timothy Nunez  has presented today for surgery, with the diagnosis of MEDIAL MENISCUS TEAR LEFT KNEE.  The various methods of treatment have been discussed with the patient and family. After consideration of risks, benefits and other options for treatment, the patient has consented to  Procedure(s): ARTHROSCOPY KNEE (Left) as a surgical intervention.  The patient's history has been reviewed, patient examined, no change in status, stable for surgery.  I have reviewed the patient's chart and labs.  Questions were answered to the patient's satisfaction.     Alta Corning

## 2019-03-22 NOTE — Anesthesia Procedure Notes (Signed)
Procedure Name: LMA Insertion Date/Time: 03/22/2019 3:22 PM Performed by: Raenette Rover, CRNA Pre-anesthesia Checklist: Patient identified, Emergency Drugs available, Suction available and Patient being monitored Patient Re-evaluated:Patient Re-evaluated prior to induction Oxygen Delivery Method: Circle system utilized Preoxygenation: Pre-oxygenation with 100% oxygen Induction Type: IV induction LMA: LMA inserted LMA Size: 5.0 Number of attempts: 1 Placement Confirmation: positive ETCO2,  CO2 detector and breath sounds checked- equal and bilateral Tube secured with: Tape Dental Injury: Teeth and Oropharynx as per pre-operative assessment

## 2019-03-22 NOTE — H&P (Signed)
A pre op hand p   Chief Complaint: l knee pain  HPI: Timothy Nunez is a 48 y.o. male who presents for evaluation of l knee pain. It has been present for greater than 3 months and has been worsening. He has failed conservative measures. Pain is rated as moderate.  Past Medical History:  Diagnosis Date  . Abdominal pain   . Blood in stool   . Elevated cholesterol   . GERD (gastroesophageal reflux disease)   . History of chest pain    03-17-2019  per pt no cardiac s&s since 2012 at ED visit @[facility]  (pt stated had some type of stress test done 15 yrs ago for work was told normal)  . History of echocardiogram    received from [facility] @ [address] , dated 10-05-2018  showed moderate concentric LVF, ef 60-65%, mild LAE  . Hyperlipemia   . Hypertension   . Nausea vomiting and diarrhea   . Sleep apnea    Past Surgical History:  Procedure Laterality Date  . CHOLECYSTECTOMY  10/12/2011   Procedure: LAPAROSCOPIC CHOLECYSTECTOMY;  Surgeon: Harl Bowie, MD;  Location: [facility];  Service: General;  Laterality: N/A;  . FOOT SURGERY     left   . VASECTOMY     Social History   Socioeconomic History  . Marital status: Significant Other    Spouse name: Not on file  . Number of children: Not on file  . Years of education: Not on file  . Highest education level: Not on file  Occupational History    Employer: [employer]eds  . Financial resource strain: Not on file  . Food insecurity    Worry: Not on file    Inability: Not on file  . Transportation needs    Medical: Not on file    Non-medical: Not on file  Tobacco Use  . Smoking status: Never Smoker  . Smokeless tobacco: Never Used  . Tobacco comment: Around tobacco   Substance and Sexual Activity  . Alcohol use: Yes    Comment: occ  . Drug use: No    Types: Marijuana  . Sexual activity: Yes    Birth control/protection: Surgical  Lifestyle  . Physical activity    Days per week: Not on file    Minutes  per session: Not on file  . Stress: Not on file  Relationships  . Social Herbalist on phone: Not on file    Gets together: Not on file    Attends religious service: Not on file    Active member of club or organization: Not on file    Attends meetings of clubs or organizations: Not on file    Relationship status: Not on file  Other Topics Concern  . Not on file  Social History Narrative  . Not on file   Family History  Problem Relation Age of Onset  . Colon polyps Father   . Hypertension Father   . Diabetes Father   . Hypertension Mother   . Stroke Mother   . CAD Mother   . Pancreatic cancer Paternal Grandfather   . Cancer Maternal Grandfather        colon  . Colon cancer Neg Hx    No Known Allergies Prior to Admission medications   Medication Sig Start Date End Date Taking? Authorizing Provider  acetaminophen (TYLENOL) 500 MG tablet Take 500 mg by mouth every 6 (six) hours as needed. For headache  Yes [provider]  amLODipine (NORVASC) 5 MG tablet Take 5 mg by mouth daily. 10/06/16  Yes [provider]  benzonatate (TESSALON) 100 MG capsule Take 2 capsules (200 mg total) by mouth every 8 (eight) hours. 10/02/17   Khatri, Hina, PA-C  esomeprazole (NEXIUM) 40 MG capsule Take 40 mg by mouth daily. 08/05/16   [provider]  fluticasone (FLONASE) 50 MCG/ACT nasal spray Place 1 spray into both nostrils daily. 10/02/17   Khatri, Hina, PA-C  ibuprofen (ADVIL,MOTRIN) 800 MG tablet Take 1 tablet (800 mg total) by mouth 3 (three) times daily. Patient not taking: Reported on 11/10/2016 05/20/13   Muthersbaugh, Jarrett Soho, PA-C  lidocaine (XYLOCAINE) 2 % solution Use as directed 15 mLs in the mouth or throat as needed for mouth pain. 10/02/17   Khatri, Hina, PA-C  meloxicam (MOBIC) 15 MG tablet Take 15 mg by mouth daily. 08/05/16   [provider]  naproxen (NAPROSYN) 500 MG tablet Take 1 tablet (500 mg total) by mouth 2 (two) times daily. 10/02/17    Khatri, Hina, PA-C  ondansetron (ZOFRAN ODT) 4 MG disintegrating tablet Take 1 tablet (4 mg total) by mouth every 8 (eight) hours as needed for nausea or vomiting. 10/02/17   Khatri, Hina, PA-C  terbinafine (LAMISIL) 250 MG tablet Take 250 mg by mouth daily. 10/06/16   [provider]     Positive ROS: none  All other systems have been reviewed and were otherwise negative with the exception of those mentioned in the HPI and as above.  Physical Exam: There were no vitals filed for this visit.  General: Alert, no acute distress Cardiovascular: No pedal edema Respiratory: No cyanosis, no use of accessory musculature GI: No organomegaly, abdomen is soft and non-tender Skin: No lesions in the area of chief complaint Neurologic: Sensation intact distally Psychiatric: Patient is competent for consent with normal mood and affect Lymphatic: No axillary or cervical lymphadenopathy  MUSCULOSKELETAL: l knee: painful rom limited rom +jt line tenderness no instability  Assessment/Plan: MEDIAL MENISCUS TEAR LEFT KNEE Plan for Procedure(s): ARTHROSCOPY KNEE  The risks benefits and alternatives were discussed with the patient including but not limited to the risks of nonoperative treatment, versus surgical intervention including infection, bleeding, nerve injury, malunion, nonunion, hardware prominence, hardware failure, need for hardware removal, blood clots, cardiopulmonary complications, morbidity, mortality, among others, and they were willing to proceed.  Predicted outcome is good, although there will be at least a six to nine month expected recovery.  Alta Corning, MD 03/22/2019 7:55 AM

## 2019-03-22 NOTE — Discharge Instructions (Signed)
POST-OP KNEE ARTHROSCOPY INSTRUCTIONS  °Dr. John Graves/Jim Bethune PA-C ° °Pain °You will be expected to have a moderate amount of pain in the affected knee for approximately two weeks. However, the first two days will be the most severe pain. A prescription has been provided to take as needed for the pain. The pain can be reduced by applying ice packs to the knee for the first 1-2 weeks post surgery. Also, keeping the leg elevated on pillows will help alleviate the pain. If you develop any acute pain or swelling in your calf muscle, please call the doctor. ° °Activity °It is preferred that you stay at bed rest for approximately 24 hours. However, you may go to the bathroom with help. Weight bearing as tolerated. You may begin the knee exercises the day of surgery. Discontinue crutches as the knee pain resolves. ° °Dressing °Keep the dressing dry. If the ace bandage should wrinkle or roll up, this can be rewrapped to prevent ridges in the bandage. You may remove all dressings in 48 hours,  apply bandaids to each wound. You may shower on the 4th day after surgery but no tub bath. ° °Symptoms to report to your doctor °Extreme pain °Extreme swelling °Temperature above 101 degrees °Change in the feeling, color, or movement of your toes °Redness, heat, or swelling at your incision ° °Exercise °If is preferred that as soon as possible you try to do a straight leg raise without bending the knee and concentrate on bringing the heel of your foot off the bed up to approximately 45 degrees and hold for the count of 10 seconds. Repeat this at least 10 times three or four times per day. Additional exercises are provided below. ° °You are encouraged to bend the knee as tolerated. ° °Follow-Up °Call to schedule a follow-up appointment in 5-7 days. Our office # is [phone]. ° °POST-OP EXERCISES ° °Short Arc Quads ° °1. Lie on back with legs straight. Place towel roll under thigh, just above knee. °2. Tighten thigh muscles to  straighten knee and lift heel off bed. °3. Hold for slow count of five, then lower. °4. Do three sets of ten ° ° ° °Straight Leg Raises ° °1. Lie on back with operative leg straight and other leg bent. °2. Keeping operative leg completely straight, slowly lift operative leg so foot is 5 inches off bed. °3. Hold for slow count of five, then lower. °4. Do three sets of ten. ° ° ° °DO BOTH EXERCISES 2 TIMES A DAY ° °Ankle Pumps ° °Work/move the operative ankle and foot up and down 10 times every hour while awake. ° ° °Post Anesthesia Home Care Instructions ° °Activity: °Get plenty of rest for the remainder of the day. A responsible adult should stay with you for 24 hours following the procedure.  °For the next 24 hours, DO NOT: °-Drive a car °-Operate machinery °-Drink alcoholic beverages °-Take any medication unless instructed by your physician °-Make any legal decisions or sign important papers. ° °Meals: °Start with liquid foods such as gelatin or soup. Progress to regular foods as tolerated. Avoid greasy, spicy, heavy foods. If nausea and/or vomiting occur, drink only clear liquids until the nausea and/or vomiting subsides. Call your physician if vomiting continues. ° °Special Instructions/Symptoms: °Your throat may feel dry or sore from the anesthesia or the breathing tube placed in your throat during surgery. If this causes discomfort, gargle with warm salt water. The discomfort should disappear within 24 hours. ° °If you   had a scopolamine patch placed behind your ear for the management of post- operative nausea and/or vomiting: ° °1. The medication in the patch is effective for 72 hours, after which it should be removed.  Wrap patch in a tissue and discard in the trash. Wash hands thoroughly with soap and water. °2. You may remove the patch earlier than 72 hours if you experience unpleasant side effects which may include dry mouth, dizziness or visual disturbances. °3. Avoid touching the patch. Wash your hands  with soap and water after contact with the patch. °  ° °

## 2019-03-22 NOTE — Transfer of Care (Signed)
Immediate Anesthesia Transfer of Care Note  Patient: Timothy Nunez  Procedure(s) Performed: ARTHROSCOPY KNEE, PARTIAL MEDIAL, PARTIAL LATERAL MENISECTOMY, CHONDROPLASTY (Left Knee)  Patient Location: PACU  Anesthesia Type:General  Level of Consciousness: awake, alert , oriented and patient cooperative  Airway & Oxygen Therapy: Patient Spontanous Breathing and Patient connected to nasal cannula oxygen  Post-op Assessment: Report given to RN and Post -op Vital signs reviewed and stable  Post vital signs: Reviewed and stable  Last Vitals:  Vitals Value Taken Time  BP 147/82 03/22/19 1608  Temp 36.8 C 03/22/19 1608  Pulse 86 03/22/19 1613  Resp 19 03/22/19 1613  SpO2 97 % 03/22/19 1613  Vitals shown include unvalidated device data.  Last Pain:  Vitals:   03/22/19 1315  TempSrc:   PainSc: 0-No pain      Patients Stated Pain Goal: 8 (33/54/56 2563)  Complications: No apparent anesthesia complications

## 2019-03-22 NOTE — Anesthesia Preprocedure Evaluation (Signed)
Anesthesia Evaluation  Patient identified by MRN, date of birth, ID band Patient awake    Reviewed: Allergy & Precautions, H&P , NPO status , Patient's Chart, lab work & pertinent test results  Airway Mallampati: II  TM Distance: >3 FB Neck ROM: Full    Dental  (+) Teeth Intact, Dental Advisory Given   Pulmonary sleep apnea and Continuous Positive Airway Pressure Ventilation ,    Pulmonary exam normal breath sounds clear to auscultation       Cardiovascular hypertension, Pt. on medications negative cardio ROS Normal cardiovascular exam Rhythm:Regular Rate:Normal     Neuro/Psych negative neurological ROS  negative psych ROS   GI/Hepatic Neg liver ROS, GERD  Medicated and Controlled,  Endo/Other  Morbid obesity  Renal/GU negative Renal ROS  negative genitourinary   Musculoskeletal negative musculoskeletal ROS (+)   Abdominal (+) + obese,   Peds negative pediatric ROS (+)  Hematology negative hematology ROS (+)   Anesthesia Other Findings   Reproductive/Obstetrics negative OB ROS                             Anesthesia Physical  Anesthesia Plan  ASA: III  Anesthesia Plan: General   Post-op Pain Management:    Induction: Intravenous  PONV Risk Score and Plan: 2 and Ondansetron, Dexamethasone and Midazolam  Airway Management Planned: LMA  Additional Equipment: None  Intra-op Plan:   Post-operative Plan: Extubation in OR  Informed Consent: I have reviewed the patients History and Physical, chart, labs and discussed the procedure including the risks, benefits and alternatives for the proposed anesthesia with the patient or authorized representative who has indicated his/her understanding and acceptance.     Dental advisory given  Plan Discussed with: CRNA  Anesthesia Plan Comments:         Anesthesia Quick Evaluation

## 2019-03-23 ENCOUNTER — Encounter (HOSPITAL_BASED_OUTPATIENT_CLINIC_OR_DEPARTMENT_OTHER): Payer: Self-pay | Admitting: Orthopedic Surgery

## 2019-03-23 NOTE — Anesthesia Postprocedure Evaluation (Signed)
Anesthesia Post Note  Patient: Timothy Nunez  Procedure(s) Performed: ARTHROSCOPY KNEE, PARTIAL MEDIAL, PARTIAL LATERAL MENISECTOMY, CHONDROPLASTY (Left Knee)     Patient location during evaluation: PACU Anesthesia Type: General Level of consciousness: awake Pain management: pain level controlled Vital Signs Assessment: post-procedure vital signs reviewed and stable Respiratory status: spontaneous breathing Cardiovascular status: stable Postop Assessment: no apparent nausea or vomiting Anesthetic complications: no    Last Vitals:  Vitals:   03/22/19 1715 03/22/19 1756  BP: (!) 156/74 (!) 164/83  Pulse: 71 71  Resp: 13 20  Temp:  36.6 C  SpO2: 92% 98%    Last Pain:  Vitals:   03/22/19 1715  TempSrc:   PainSc: 4    Pain Goal: Patients Stated Pain Goal: 8 (03/22/19 1315)                 Huston Foley

## 2019-03-23 NOTE — Op Note (Signed)
NAME: Timothy Nunez, Timothy Nunez MEDICAL RECORD [id number] ACCOUNT [id number] DATE OF BIRTH:09-09-1970 FACILITY: [facility] LOCATION: [facility] PHYSICIAN:Sabrena Gavitt Maudie Mercury, MD  OPERATIVE REPORT  DATE OF PROCEDURE:  03/22/2019  PREOPERATIVE DIAGNOSES: 1.  Medial and lateral meniscal tears. 2.  Chondromalacia medial, lateral and patellofemoral.  POSTOPERATIVE DIAGNOSES:   1.  Medial and lateral meniscal tears. 2.  Chondromalacia medial, lateral and patellofemoral. 3.  Medial shelf plica.  PROCEDURE: 1.  Partial medial and partial lateral meniscectomy. 2.  Chondroplasty medial femoral condyle, lateral tibial plateau, and patellofemoral trochlea and posterior surface of the patella.  SURGEON:  Dorna Leitz, MD  ASSISTANT:  Gaspar Skeeters, PA-C, was present for the entire case and assisted by manipulation of the leg and closing to minimize OR time.  BRIEF HISTORY:  The patient is a 48 year old male with a long history of significant complaints of left knee pain.  He had been treated conservatively for a prolonged period of time.  After failure of conservative care, MRI was obtained, which showed  that he had medial and lateral meniscal tearing as well as significant chondromalacia.  He did not have x-rays showing bone-on-bone change and we felt that arthroscopy was appropriate.  He was brought to the operating room for this procedure.  DESCRIPTION OF PROCEDURE:  The patient was brought to the operating room after adequate anesthesia was obtained with a general anesthetic.  The patient was placed supine on the operating table.  Left leg was prepped and draped in usual sterile fashion.   Following this, routine arthroscopic examination of knee revealed that there was a significant posterior horn medial meniscal tear with a curved flap at the posterior third.  This was debrided back to a smooth and stable rim and took about 15% of the  posterior horn of the meniscus.  Once this was completed, attention was  turned to the ACL, which was normal.  Attention was turned laterally.  There was an anterolateral meniscal tear pretty significant.  We took about 3/4 of the anterior horn of the  medial meniscus to get it out.  There was significant chondromalacia in the anterolateral tibial plateau which was debrided.  Attention was then turned back up to the patellofemoral joint where there was significant chondromalacia of the patellofemoral  trochlea as well as the posterior surface of the patella.  All this was debrided.  Once this was done, the knee was copiously and thoroughly irrigated and suctioned dry.  The arthroscopic portals closed with a bandage.  Sterile compressive dressing was  applied.  The patient was taken to recovery and noted to be in satisfactory condition.  Estimated blood loss for procedure was minimal.  TN/NUANCE  D:03/23/2019 T:03/23/2019 JOB:[id number]

## 2019-03-30 NOTE — Op Note (Signed)
NAME: Timothy Nunez, Timothy Nunez MEDICAL RECORD [id number] ACCOUNT [id number] DATE OF BIRTH:1970-11-29 FACILITY: [facility] LOCATION: [facility] PHYSICIAN:Annalisia Ingber Maudie Mercury, MD  OPERATIVE REPORT  DATE OF PROCEDURE:  03/30/2019  ADDENDUM:  The 2nd surgical procedure should be:  2.  Abrasion arthroplasty, medial femoral condyle, lateral tibial plateau, patellofemoral trochlea, and posterior surface of the patella.  In the body of the dictation, the addendum should read:  Attention was turned to the medial femoral condyle where there were significant degenerative changes of the medial femoral condyle, and abrasion arthroplasty was performed with a suction shaver.  Attention was turned to the lateral side where there was  significant chondral injury on the lateral tibial plateau, and abrasion arthroplasty was performed with a suction shaver.  Attention was turned to the patellofemoral joint where on the posterior surface of the patella and on the trochlea abrasion  arthroplasty was performed down to bleeding bone where necessary.  LN/NUANCE  D:03/30/2019 T:03/30/2019 JOB:[id number]

## 2021-09-12 ENCOUNTER — Encounter: Payer: Self-pay | Admitting: *Deleted

## 2021-09-30 ENCOUNTER — Encounter: Payer: Self-pay | Admitting: Internal Medicine

## 2022-01-27 ENCOUNTER — Emergency Department (HOSPITAL_COMMUNITY)
Admission: EM | Admit: 2022-01-27 | Discharge: 2022-01-27 | Disposition: A | Discharge status: Home / Self Care | Payer: 59 | Attending: Emergency Medicine | Admitting: Emergency Medicine

## 2022-01-27 ENCOUNTER — Emergency Department (HOSPITAL_BASED_OUTPATIENT_CLINIC_OR_DEPARTMENT_OTHER): Payer: 59

## 2022-01-27 ENCOUNTER — Encounter (HOSPITAL_COMMUNITY): Payer: Self-pay

## 2022-01-27 ENCOUNTER — Encounter (HOSPITAL_BASED_OUTPATIENT_CLINIC_OR_DEPARTMENT_OTHER): Payer: Self-pay

## 2022-01-27 ENCOUNTER — Other Ambulatory Visit: Payer: Self-pay

## 2022-01-27 ENCOUNTER — Emergency Department (HOSPITAL_BASED_OUTPATIENT_CLINIC_OR_DEPARTMENT_OTHER)
Admission: EM | Admit: 2022-01-27 | Discharge: 2022-01-27 | Disposition: A | Discharge status: Home / Self Care | Payer: 59 | Source: Home / Self Care | Attending: Emergency Medicine | Admitting: Emergency Medicine

## 2022-01-27 DIAGNOSIS — Z79899 Other long term (current) drug therapy: Secondary | ICD-10-CM | POA: Insufficient documentation

## 2022-01-27 DIAGNOSIS — R1013 Epigastric pain: Secondary | ICD-10-CM | POA: Insufficient documentation

## 2022-01-27 DIAGNOSIS — K920 Hematemesis: Secondary | ICD-10-CM | POA: Insufficient documentation

## 2022-01-27 LAB — CBC
HCT: 44.1 % (ref 39.0–52.0)
Hemoglobin: 14.4 g/dL (ref 13.0–17.0)
MCH: 27.7 pg (ref 26.0–34.0)
MCHC: 32.7 g/dL (ref 30.0–36.0)
MCV: 84.8 fL (ref 80.0–100.0)
Platelets: 212 10*3/uL (ref 150–400)
RBC: 5.2 MIL/uL (ref 4.22–5.81)
RDW: 16 % — ABNORMAL HIGH (ref 11.5–15.5)
WBC: 8.2 10*3/uL (ref 4.0–10.5)
nRBC: 0 % (ref 0.0–0.2)

## 2022-01-27 LAB — COMPREHENSIVE METABOLIC PANEL
ALT: 31 U/L (ref 0–44)
AST: 22 U/L (ref 15–41)
Albumin: 4.4 g/dL (ref 3.5–5.0)
Alkaline Phosphatase: 100 U/L (ref 38–126)
Anion gap: 7 (ref 5–15)
BUN: 14 mg/dL (ref 6–20)
CO2: 29 mmol/L (ref 22–32)
Calcium: 9.8 mg/dL (ref 8.9–10.3)
Chloride: 107 mmol/L (ref 98–111)
Creatinine, Ser: 0.96 mg/dL (ref 0.61–1.24)
GFR, Estimated: >60 mL/min (ref >60)
Glucose, Bld: 90 mg/dL (ref 70–99)
Potassium: 3.9 mmol/L (ref 3.5–5.1)
Sodium: 143 mmol/L (ref 135–145)
Total Bilirubin: 0.7 mg/dL (ref 0.3–1.2)
Total Protein: 8.6 g/dL — ABNORMAL HIGH (ref 6.5–8.1)

## 2022-01-27 LAB — LIPASE, BLOOD: Lipase: 22 U/L (ref 11–51)

## 2022-01-27 MED ORDER — SODIUM CHLORIDE 0.9 % IV BOLUS (SEPSIS)
1000.0000 mL | Freq: Once | INTRAVENOUS | Status: AC
  Administered 2022-01-27: 1000 mL via INTRAVENOUS

## 2022-01-27 MED ORDER — IOHEXOL 300 MG/ML  SOLN
125.0000 mL | Freq: Once | INTRAMUSCULAR | Status: AC | PRN
  Administered 2022-01-27: 125 mL via INTRAVENOUS

## 2022-01-27 MED ORDER — ONDANSETRON HCL 4 MG/2ML IJ SOLN
4.0000 mg | Freq: Once | INTRAMUSCULAR | Status: AC
  Administered 2022-01-27: 4 mg via INTRAVENOUS
  Filled 2022-01-27: qty 2

## 2022-01-27 MED ORDER — PANTOPRAZOLE SODIUM 40 MG IV SOLR
40.0000 mg | Freq: Once | INTRAVENOUS | Status: AC
  Administered 2022-01-27: 40 mg via INTRAVENOUS
  Filled 2022-01-27: qty 10

## 2022-01-27 MED ORDER — SODIUM CHLORIDE 0.9 % IV SOLN: 1000.0000 mL | INTRAVENOUS | Status: DC

## 2022-01-27 MED ORDER — PANTOPRAZOLE SODIUM 20 MG PO TBEC: 20.0000 mg | DELAYED_RELEASE_TABLET | Freq: Two times a day (BID) | ORAL | 0 refills | Status: AC

## 2022-01-27 MED ORDER — ONDANSETRON 8 MG PO TBDP: 8.0000 mg | ORAL_TABLET | Freq: Three times a day (TID) | ORAL | 0 refills | Status: AC | PRN

## 2022-01-27 NOTE — ED Triage Notes (Signed)
Pt arrived via POV, c/o nausea and vomiting blood x2 today.

## 2022-01-27 NOTE — ED Provider Notes (Signed)
[facility]PART[facility] Provider Note   CSN: [id number] Arrival date & time: 01/27/22  1758     History  Chief Complaint  Patient presents with   Hematemesis    Timothy Nunez is a 51 y.o. male.  HPI  Patient has history of hyperlipidemia sleep apnea nausea vomiting, GERD, elevated cholesterol.  Patient states short time ago he noticed that he spit up some blood.  He did not see anyone for it.  Today however when he was at work he had an episode of emesis.  At first it was clear but then appeared bloody.  Patient's had some mild upper abdominal discomfort.  He is not having any trouble with blood in his stool.  He does not have any prior history of GI bleeding.  Home Medications Prior to Admission medications   Medication Sig Start Date End Date Taking? Authorizing Provider  ondansetron (ZOFRAN-ODT) 8 MG disintegrating tablet Take 1 tablet (8 mg total) by mouth every 8 (eight) hours as needed for nausea or vomiting. 01/27/22  Yes Dorie Rank, MD  pantoprazole (PROTONIX) 20 MG tablet Take 1 tablet (20 mg total) by mouth 2 (two) times daily for 14 days. 01/27/22 02/10/22 Yes Dorie Rank, MD  acetaminophen (TYLENOL) 500 MG tablet Take 500 mg by mouth every 6 (six) hours as needed. For headache    [provider]  amLODipine (NORVASC) 5 MG tablet Take 5 mg by mouth daily. 10/06/16   [provider]  esomeprazole (NEXIUM) 40 MG capsule Take 40 mg by mouth daily. 08/05/16   [provider]  naproxen (NAPROSYN) 500 MG tablet Take 1 tablet (500 mg total) by mouth 2 (two) times daily. 10/02/17   Khatri, Hina, PA-C  oxyCODONE-acetaminophen (PERCOCET/ROXICET) 5-325 MG tablet Take 1-2 tablets by mouth every 6 (six) hours as needed for severe pain. 03/22/19   Gary Fleet, PA-C  terbinafine (LAMISIL) 250 MG tablet Take 250 mg by mouth daily. 10/06/16   [provider]      Allergies    Patient has no known allergies.    Review of Systems    Review of Systems  Constitutional:  Negative for fever.    Physical Exam Updated Vital Signs BP 137/88 (BP Location: Right Arm)   Pulse 64   Temp 98.7 F (37.1 C) (Oral)   Resp 18   Ht 1.905 m ('6\' 3"'$ )   Wt (!) 163.7 kg   SpO2 97%   BMI 45.12 kg/m  Physical Exam Vitals and nursing note reviewed.  Constitutional:      General: He is not in acute distress.    Appearance: He is well-developed. He is obese.  HENT:     Head: Normocephalic and atraumatic.     Right Ear: External ear normal.     Left Ear: External ear normal.  Eyes:     General: No scleral icterus.       Right eye: No discharge.        Left eye: No discharge.     Conjunctiva/sclera: Conjunctivae normal.  Neck:     Trachea: No tracheal deviation.  Cardiovascular:     Rate and Rhythm: Normal rate and regular rhythm.  Pulmonary:     Effort: Pulmonary effort is normal. No respiratory distress.     Breath sounds: Normal breath sounds. No stridor. No wheezing or rales.  Abdominal:     General: Bowel sounds are normal. There is no distension.     Palpations: Abdomen is soft.  Tenderness: There is abdominal tenderness. There is no guarding or rebound.     Comments: Mild tenderness palpation epigastric region  Musculoskeletal:        General: No tenderness or deformity.     Cervical back: Neck supple.  Skin:    General: Skin is warm and dry.     Findings: No rash.  Neurological:     General: No focal deficit present.     Mental Status: He is alert.     Cranial Nerves: No cranial nerve deficit (no facial droop, extraocular movements intact, no slurred speech).     Sensory: No sensory deficit.     Motor: No abnormal muscle tone or seizure activity.     Coordination: Coordination normal.  Psychiatric:        Mood and Affect: Mood normal.     ED Results / Procedures / Treatments   Labs (all labs ordered are listed, but only abnormal results are displayed) Labs Reviewed - No data to  display  EKG None  Radiology CT ABDOMEN PELVIS W CONTRAST  Result Date: 01/27/2022 CLINICAL DATA:  51 y/o male. Pt reports vomiting and spitting up blood intermittently for 3 months. EXAM: CT ABDOMEN AND PELVIS WITH CONTRAST TECHNIQUE: Multidetector CT imaging of the abdomen and pelvis was performed using the standard protocol following bolus administration of intravenous contrast. RADIATION DOSE REDUCTION: This exam was performed according to the departmental dose-optimization program which includes automated exposure control, adjustment of the mA and/or kV according to patient size and/or use of iterative reconstruction technique. CONTRAST:  190m OMNIPAQUE IOHEXOL 300 MG/ML  SOLN COMPARISON:  CT abdomen pelvis 11/10/2016 FINDINGS: Lower chest: No acute abnormality. Hepatobiliary: No focal liver abnormality. Status post cholecystectomy. No biliary dilatation. Pancreas: No focal lesion. Normal pancreatic contour. No surrounding inflammatory changes. No main pancreatic ductal dilatation. Spleen: Normal in size without focal abnormality. Adrenals/Urinary Tract: No adrenal nodule bilaterally. Bilateral kidneys enhance symmetrically. No hydronephrosis. No hydroureter. The urinary bladder is unremarkable. Stomach/Bowel: Stomach is within normal limits. No evidence of bowel wall thickening or dilatation. Appendix appears normal. Vascular/Lymphatic: No abdominal aorta or iliac aneurysm. Mild atherosclerotic plaque of the aorta and its branches. No abdominal, pelvic, or inguinal lymphadenopathy. Reproductive: Prostate is unremarkable. Other: No intraperitoneal free fluid. No intraperitoneal free gas. No organized fluid collection. Musculoskeletal: No abdominal wall hernia or abnormality. No suspicious lytic or blastic osseous lesions. No acute displaced fracture. Multilevel degenerative changes of the spine. IMPRESSION: 1. No acute intra-abdominal or intrapelvic abnormality. 2.  Aortic Atherosclerosis  (ICD10-I70.0). Electronically Signed   By: MIven FinnM.D.   On: 01/27/2022 20:58    Procedures Procedures    Medications Ordered in ED Medications  sodium chloride 0.9 % bolus 1,000 mL (1,000 mLs Intravenous New Bag/Given 01/27/22 2041)    Followed by  0.9 %  sodium chloride infusion (has no administration in time range)  pantoprazole (PROTONIX) injection 40 mg (40 mg Intravenous Given 01/27/22 2042)  ondansetron (ZOFRAN) injection 4 mg (4 mg Intravenous Given 01/27/22 2042)  iohexol (OMNIPAQUE) 300 MG/ML solution 125 mL (125 mLs Intravenous Contrast Given 01/27/22 2019)    ED Course/ Medical Decision Making/ A&P                           Medical Decision Making Problems Addressed: Epigastric pain: acute illness or injury Hematemesis, unspecified whether nausea present: acute illness or injury that poses a threat to life or bodily functions  Amount and/or  Complexity of Data Reviewed Radiology: ordered.  Risk Prescription drug management.  Patient went to [facility] earlier today.  He had laboratory tests.  Those were reviewed.  Patient has a normal CBC hemoglobin is 14.4.  Metabolic panel was normal, normal lFTs.  Lipase is normal at 22  Patient presented to ED with complaints of upper abdominal pain intermittent spitting up blood and episode of hematemesis today.  Concerned about the possibility of severe GI bleeding.  Fortunately patient's work-up today is reassuring.  He does not have any evidence of anemia.  Patient's not had any recurrent episodes of vomiting here in the ED.  Laboratory test done at [facility] today were unremarkable.  CT scan does not show any signs of any acute abnormality.  Unclear if this could be a [facility] tear versus peptic ulcer disease gastritis.  We will start him on a course of antacids and recommend close outpatient follow-up with GI.  Evaluation and diagnostic testing in the emergency department does not suggest an  emergent condition requiring admission or immediate intervention beyond what has been performed at this time.  The patient is safe for discharge and has been instructed to return immediately for worsening symptoms, change in symptoms or any other concerns.         Final Clinical Impression(s) / ED Diagnoses Final diagnoses:  Epigastric pain  Hematemesis, unspecified whether nausea present    Rx / DC Orders ED Discharge Orders          Ordered    pantoprazole (PROTONIX) 20 MG tablet  2 times daily        01/27/22 2134    ondansetron (ZOFRAN-ODT) 8 MG disintegrating tablet  Every 8 hours PRN        01/27/22 2134              Dorie Rank, MD 01/27/22 2138

## 2022-01-27 NOTE — ED Triage Notes (Signed)
Pt reports vomiting and spitting up blood intermittently for 3 months. Pt states that he just wants to figure out what's going on. Pt ambulatory to triage. NAD noted.

## 2022-01-27 NOTE — ED Provider Triage Note (Signed)
Emergency Medicine Provider Triage Evaluation Note  Timothy Nunez , a 51 y.o. male  was evaluated in triage.  Pt complains of vomiting and hematemesis.  Ports that he was at work when he felt sick on his stomach.  He threw up once, and states that it looked like his medication he had taken that morning.  Afterwards he retched again and it looked like bright red blood.  States he has not eaten yet this morning.  Admits to occasional alcohol use, drinks this weekend, but no more than his normal.  Denies daily alcohol use.  States he has had his gallbladder out, but no other GI issues.  Review of Systems  Positive: Nausea, hematemesis Negative: Abdominal pain, diarrhea, fever  Physical Exam  BP (!) 159/108 (BP Location: Left Arm)   Pulse 70   Resp 20   SpO2 100%  Gen:   Awake, no distress   Resp:  Normal effort  MSK:   Moves extremities without difficulty  Other:    Medical Decision Making  Medically screening exam initiated at 10:51 AM.  Appropriate orders placed.  Timothy Nunez was informed that the remainder of the evaluation will be completed by another provider, this initial triage assessment does not replace that evaluation, and the importance of remaining in the ED until their evaluation is complete.     Lorna Strother T, PA-C 01/27/22 1052

## 2022-01-27 NOTE — Discharge Instructions (Signed)
Take the medications as prescribed.  Follow-up with a GI doctor for further evaluation.  Return to a hospital emergency room for recurrent symptoms

## 2022-01-27 NOTE — ED Notes (Signed)
Patient verbalizes understanding of discharge instructions. Opportunity for questioning and answers were provided. Armband removed by staff, pt discharged from ED. Ambulated out to lobby  

## 2022-03-09 ENCOUNTER — Encounter (HOSPITAL_BASED_OUTPATIENT_CLINIC_OR_DEPARTMENT_OTHER): Payer: Self-pay | Admitting: *Deleted

## 2022-03-09 ENCOUNTER — Emergency Department (HOSPITAL_BASED_OUTPATIENT_CLINIC_OR_DEPARTMENT_OTHER)
Admission: EM | Admit: 2022-03-09 | Discharge: 2022-03-09 | Disposition: A | Discharge status: Home / Self Care | Payer: 59 | Attending: Emergency Medicine | Admitting: Emergency Medicine

## 2022-03-09 DIAGNOSIS — R1011 Right upper quadrant pain: Secondary | ICD-10-CM | POA: Diagnosis not present

## 2022-03-09 DIAGNOSIS — Z79899 Other long term (current) drug therapy: Secondary | ICD-10-CM | POA: Insufficient documentation

## 2022-03-09 DIAGNOSIS — K5900 Constipation, unspecified: Secondary | ICD-10-CM | POA: Diagnosis not present

## 2022-03-09 DIAGNOSIS — I10 Essential (primary) hypertension: Secondary | ICD-10-CM | POA: Diagnosis not present

## 2022-03-09 DIAGNOSIS — R1013 Epigastric pain: Secondary | ICD-10-CM | POA: Diagnosis not present

## 2022-03-09 DIAGNOSIS — K2960 Other gastritis without bleeding: Secondary | ICD-10-CM

## 2022-03-09 LAB — CBC WITH DIFFERENTIAL/PLATELET
Abs Immature Granulocytes: 0.02 10*3/uL (ref 0.00–0.07)
Basophils Absolute: 0 10*3/uL (ref 0.0–0.1)
Basophils Relative: 1 %
Eosinophils Absolute: 0.2 10*3/uL (ref 0.0–0.5)
Eosinophils Relative: 4 %
HCT: 41 % (ref 39.0–52.0)
Hemoglobin: 13.4 g/dL (ref 13.0–17.0)
Immature Granulocytes: 0 %
Lymphocytes Relative: 30 %
Lymphs Abs: 1.9 10*3/uL (ref 0.7–4.0)
MCH: 27.3 pg (ref 26.0–34.0)
MCHC: 32.7 g/dL (ref 30.0–36.0)
MCV: 83.7 fL (ref 80.0–100.0)
Monocytes Absolute: 0.4 10*3/uL (ref 0.1–1.0)
Monocytes Relative: 6 %
Neutro Abs: 3.8 10*3/uL (ref 1.7–7.7)
Neutrophils Relative %: 59 %
Platelets: 197 10*3/uL (ref 150–400)
RBC: 4.9 MIL/uL (ref 4.22–5.81)
RDW: 15.9 % — ABNORMAL HIGH (ref 11.5–15.5)
WBC: 6.4 10*3/uL (ref 4.0–10.5)
nRBC: 0 % (ref 0.0–0.2)

## 2022-03-09 LAB — HEPATIC FUNCTION PANEL
ALT: 23 U/L (ref 0–44)
AST: 19 U/L (ref 15–41)
Albumin: 3.6 g/dL (ref 3.5–5.0)
Alkaline Phosphatase: 103 U/L (ref 38–126)
Bilirubin, Direct: <0.1 mg/dL (ref 0.0–0.2)
Total Bilirubin: 0.3 mg/dL (ref 0.3–1.2)
Total Protein: 7.1 g/dL (ref 6.5–8.1)

## 2022-03-09 LAB — BASIC METABOLIC PANEL
Anion gap: 4 — ABNORMAL LOW (ref 5–15)
BUN: 13 mg/dL (ref 6–20)
CO2: 30 mmol/L (ref 22–32)
Calcium: 9 mg/dL (ref 8.9–10.3)
Chloride: 107 mmol/L (ref 98–111)
Creatinine, Ser: 1.17 mg/dL (ref 0.61–1.24)
GFR, Estimated: >60 mL/min (ref >60)
Glucose, Bld: 107 mg/dL — ABNORMAL HIGH (ref 70–99)
Potassium: 3.8 mmol/L (ref 3.5–5.1)
Sodium: 141 mmol/L (ref 135–145)

## 2022-03-09 LAB — LIPASE, BLOOD: Lipase: 25 U/L (ref 11–51)

## 2022-03-09 MED ORDER — SENNOSIDES-DOCUSATE SODIUM 8.6-50 MG PO TABS: 2.0000 | ORAL_TABLET | Freq: Two times a day (BID) | ORAL | 0 refills | Status: AC

## 2022-03-09 MED ORDER — POLYETHYLENE GLYCOL 3350 17 GM/SCOOP PO POWD: 1.0000 | Freq: Once | ORAL | 0 refills | Status: AC

## 2022-03-09 NOTE — ED Notes (Signed)
Lab phoned to add additional labs ordered by ED MD

## 2022-03-09 NOTE — ED Provider Notes (Signed)
[facility]ENCY DEPARTMENT Provider Note   CSN: [id number] Arrival date & time: 03/09/22  0703     History  Chief Complaint  Patient presents with   Abdominal Pain    Timothy Nunez is a 51 y.o. male.  Patient presented with abdominal pain that is subacute in nature.  He has PMHx of HTN, HLD, hx of gastritis, and class III obesity. He was seen in the ED last month and was referred to GI.  He has seen GI and had endoscopy while still having some pain.  Patient characterizes the pain as more discomfort than pain.  It is located in the right upper quadrant with radiation in the epigastric region.  He is denying any sick contacts.  He is endorsing constipation for the last 4 weeks with minimal bowel movements with his baseline being 2-3 bowel movements a day.  The history is provided by the patient and medical records.  Abdominal Pain Pain location:  RUQ and epigastric Pain quality: aching   Pain radiates to:  Epigastric region Pain severity:  Mild Onset quality:  Gradual Duration:  4 weeks Timing:  Intermittent Progression:  Waxing and waning Chronicity:  New Relieved by:  Position changes Worsened by:  Coughing Associated symptoms: constipation   Associated symptoms: no chest pain, no chills, no fatigue, no fever, no shortness of breath and no sore throat   Risk factors: NSAID use and obesity        Home Medications Prior to Admission medications   Medication Sig Start Date End Date Taking? Authorizing Provider  acetaminophen (TYLENOL) 500 MG tablet Take 500 mg by mouth every 6 (six) hours as needed. For headache    [provider]  amLODipine (NORVASC) 5 MG tablet Take 5 mg by mouth daily. 10/06/16   [provider]  esomeprazole (NEXIUM) 40 MG capsule Take 40 mg by mouth daily. 08/05/16   [provider]  naproxen (NAPROSYN) 500 MG tablet Take 1 tablet (500 mg total) by mouth 2 (two) times daily. 10/02/17   Khatri, Hina, PA-C   ondansetron (ZOFRAN-ODT) 8 MG disintegrating tablet Take 1 tablet (8 mg total) by mouth every 8 (eight) hours as needed for nausea or vomiting. 01/27/22   Dorie Rank, MD  oxyCODONE-acetaminophen (PERCOCET/ROXICET) 5-325 MG tablet Take 1-2 tablets by mouth every 6 (six) hours as needed for severe pain. 03/22/19   Gary Fleet, PA-C  pantoprazole (PROTONIX) 20 MG tablet Take 1 tablet (20 mg total) by mouth 2 (two) times daily for 14 days. 01/27/22 02/10/22  Dorie Rank, MD  terbinafine (LAMISIL) 250 MG tablet Take 250 mg by mouth daily. 10/06/16   [provider]      Allergies    Patient has no known allergies.    Review of Systems   Review of Systems  Constitutional:  Negative for chills, fatigue and fever.  HENT:  Negative for sinus pain, sore throat and trouble swallowing.   Respiratory:  Negative for chest tightness and shortness of breath.   Cardiovascular:  Negative for chest pain and leg swelling.  Gastrointestinal:  Positive for abdominal pain and constipation.  Endocrine: Negative for polydipsia and polyuria.  Genitourinary:  Negative for difficulty urinating and flank pain.  Musculoskeletal:  Negative for arthralgias, joint swelling and neck pain.  Skin:  Negative for rash and wound.  Neurological:  Negative for dizziness, facial asymmetry and light-headedness.  Hematological:  Negative for adenopathy. Does not bruise/bleed easily.  Psychiatric/Behavioral:  Negative for agitation and behavioral  problems.     Physical Exam Updated Vital Signs BP 132/66 (BP Location: Right Arm)   Pulse 68   Temp 97.7 F (36.5 C) (Oral)   Resp 20   Ht '6\' 3"'$  (1.905 m)   Wt (!) 158.8 kg   SpO2 98%   BMI 43.75 kg/m  Physical Exam Gen: Obese male in NAD HENT: NCAT, MMM Resp: CTAB CV: RRR, no m/r/g Abd: soft, normal bowel sounds, TTP in epigastric and RUQ. No TTP in other regions MSK: No asymmetry, normal bulk and tone Skin: No rashes on examined skin Neuro: alert and oriented  x4 Psych: anxious mood  ED Results / Procedures / Treatments   Labs (all labs ordered are listed, but only abnormal results are displayed) Labs Reviewed  CBC WITH DIFFERENTIAL/PLATELET - Abnormal; Notable for the following components:      Result Value   RDW 15.9 (*)    All other components within normal limits  BASIC METABOLIC PANEL - Abnormal; Notable for the following components:   Glucose, Bld 107 (*)    Anion gap 4 (*)    All other components within normal limits  HEPATIC FUNCTION PANEL  LIPASE, BLOOD    EKG None  Radiology No results found.  Procedures Procedures    Medications Ordered in ED Medications - No data to display  ED Course/ Medical Decision Making/ A&P                           Medical Decision Making Patient presented with subacute abdominal pain.  Differential diagnosis included gastritis versus constipation induced versus pancreatitis versus acute hepatitis.  Most likely gastritis given history of gastritis previously and has continued NSAID use.  Patient has GI follow-up and had endoscopy done but the results of that endoscopy were not available.  He had endoscopy done in 2013 that showed gastritis as well and based on those results it appears he is still struggling with this.  He is also endorsing constipation for the last 4 weeks which can be contributing to his discomfort.  Plan is to give him a bowel regimen to optimize his bowel function.  Pancreatitis less likely with a negative lipase and acute hepatitis ruled out with negative LFTs. Plan -Continue PPI, continue to follow with GI and his PCP -Avoid NSAID, if epigastric pain bothers him advised him to take Maalox OTC -Bowel regimen with Senokot -S and MiraLAX.   Amount and/or Complexity of Data Reviewed External Data Reviewed: labs, radiology and notes.    Details: Previous labs reviewed.  Previous radiology reviewed including CT scan done in the last ED visit.  Notes from his previous  providers reviewed. Labs: ordered.    Details: Labs ordered including CBC, BMP, hepatic panel, and lipase. Radiology:     Details: Based on the lab results no radiological testing is required.  Risk OTC drugs.           Final Clinical Impression(s) / ED Diagnoses Final diagnoses:  None    Rx / DC Orders ED Discharge Orders     None         Idamae Schuller, MD 03/09/22 7510    Margette Fast, MD 03/10/22 [id number]

## 2022-03-09 NOTE — Discharge Instructions (Addendum)
You came in with abdominal pain that has been occurring since last month.  It appears this is most likely due to your gastritis that has been an issue for you.  Most patients will also be causing you some discomfort so we will start you on a bowel regimen.  The steps to do for you after leaving the ED today include the following: - Start bowel regimen with Senokot-S and MiraLAX and continue Senokot-S as needed once you have regular bowel movements -Continue PPI, call your GI doctor to find out the results of the EGD and what they are recommend you do. -Avoid NSAIDs, such as Motrin, ibuprofen or aspirin.  You can take Maalox or Tylenol for pain as needed --Follow-up with your primary care provider -If you have severe abdominal pain, pain with fevers and chills if you need emergency medical treatment.

## 2022-03-09 NOTE — ED Notes (Signed)
ED Provider at bedside. 

## 2022-03-09 NOTE — ED Triage Notes (Signed)
Presents with RUQ pain, for the past 2 weeks. Has had some nausea and occ vomiting, vomiting normally occurs first thing in AM.

## 2022-12-08 IMAGING — CT CT ABD-PELV W/ CM
2 of 4 series · 16 of 46 positions shown, 18 images · IV contrast (agent unspecified)
Comparison: CT abdomen pelvis 11/10/2016

CLINICAL DATA: 50 y/o male. Pt reports vomiting and spitting up
blood intermittently for 3 months.

EXAM:
CT ABDOMEN AND PELVIS WITH CONTRAST
TECHNIQUE: Multidetector CT imaging of the abdomen and pelvis was performed
using the standard protocol following bolus administration of
intravenous contrast.

[Series 2: axial st · axial · 0.98mm/px · z∈[+758,+1228]mm · 13 of 104 slices shown, 15 images]
[im 5/104  soft-tissue]
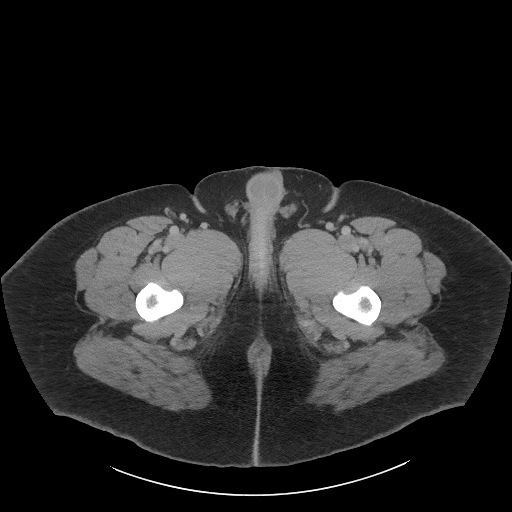
[im 5/104  bone]
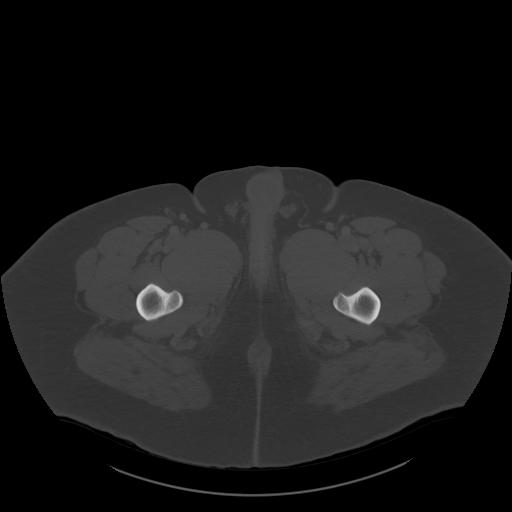
[im 13/104  soft-tissue]
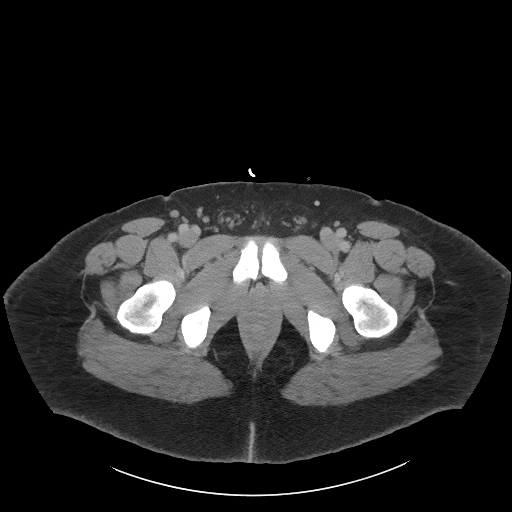
[im 22/104  soft-tissue]
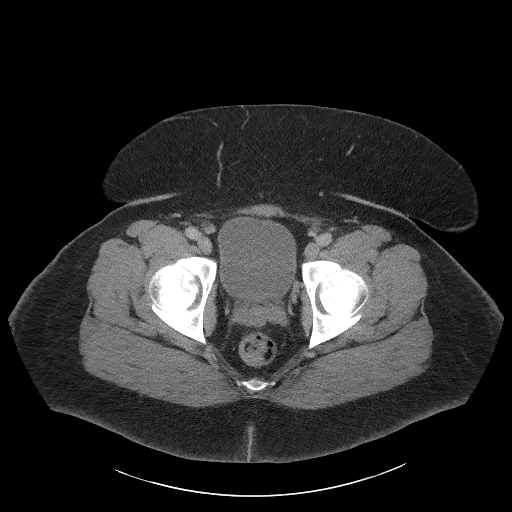
[im 31/104  soft-tissue]
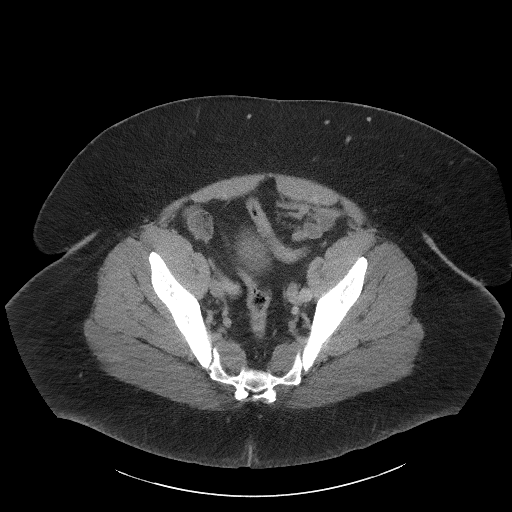
[im 35/104  soft-tissue]
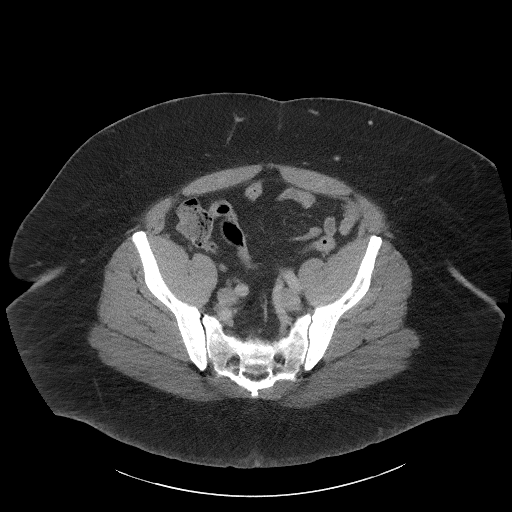
[im 43/104  soft-tissue]
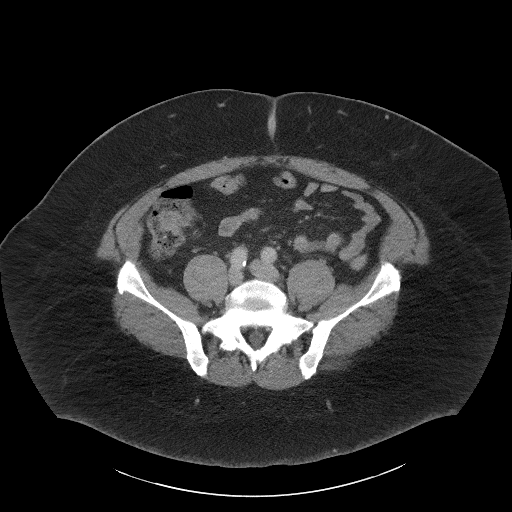
[im 52/104  soft-tissue]
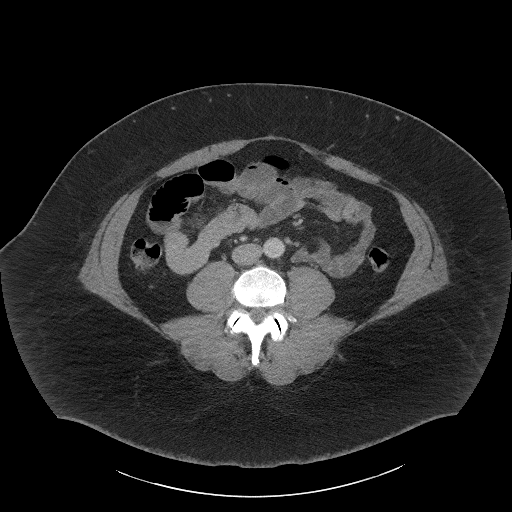
[im 61/104  soft-tissue]
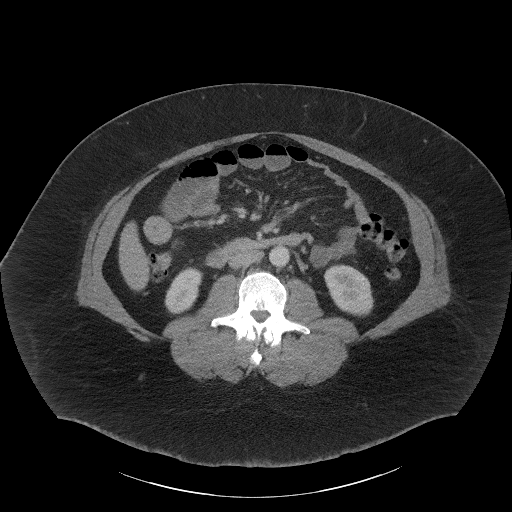
[im 69/104  soft-tissue]
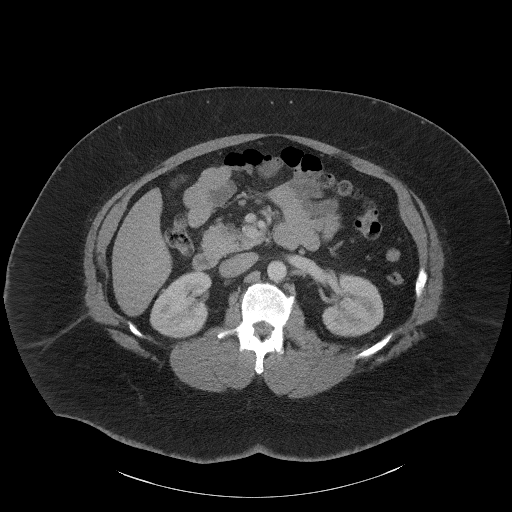
[im 69/104  bone]
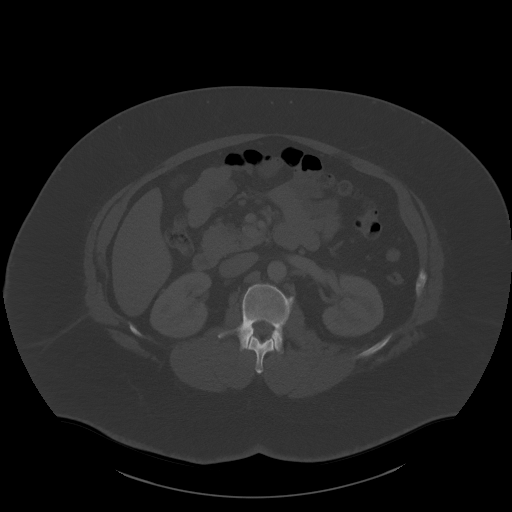
[im 73/104  soft-tissue]
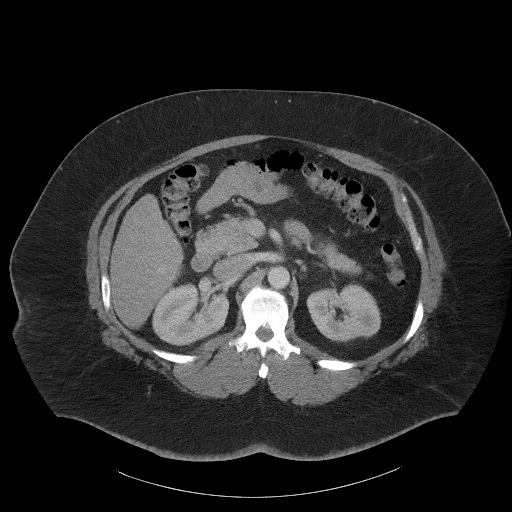
[im 82/104  soft-tissue]
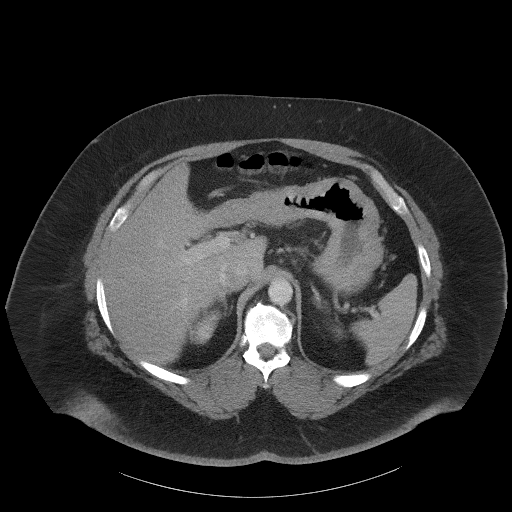
[im 91/104  soft-tissue]
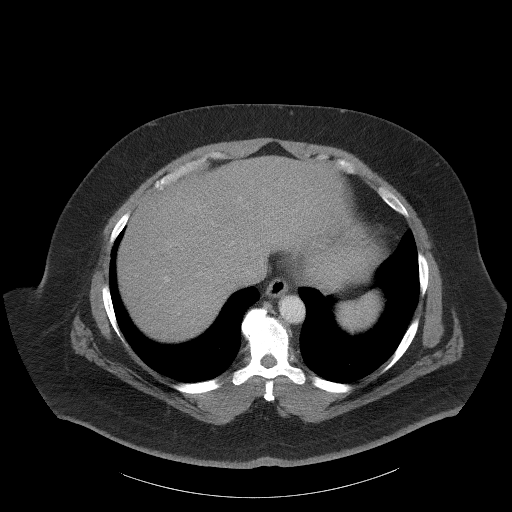
[im 99/104  soft-tissue]
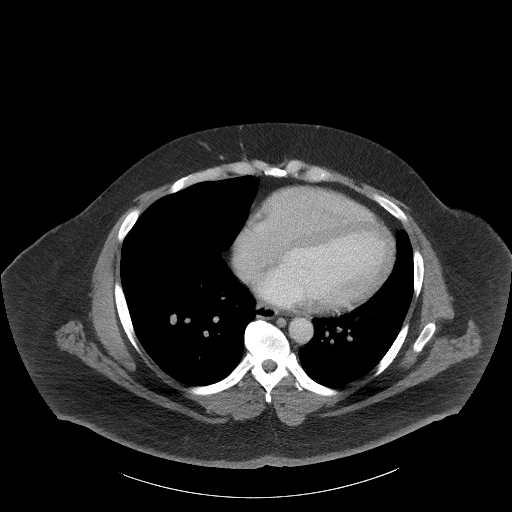

[Series 5: coronal st · coronal · 0.89mm/px · 3 of 118 slices shown]
[im 40/118  soft-tissue]
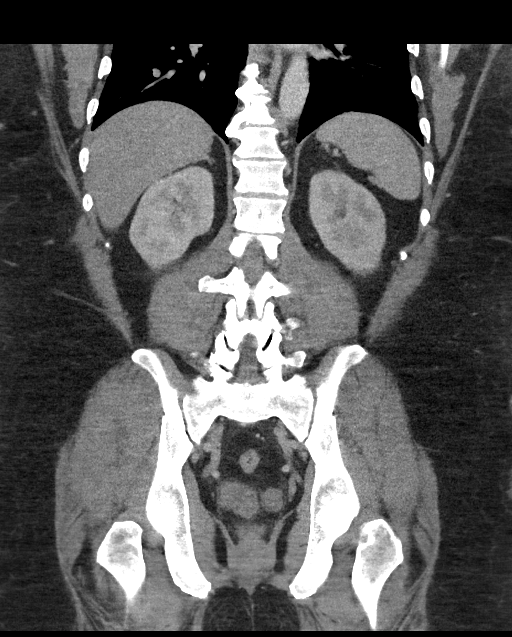
[im 53/118  soft-tissue]
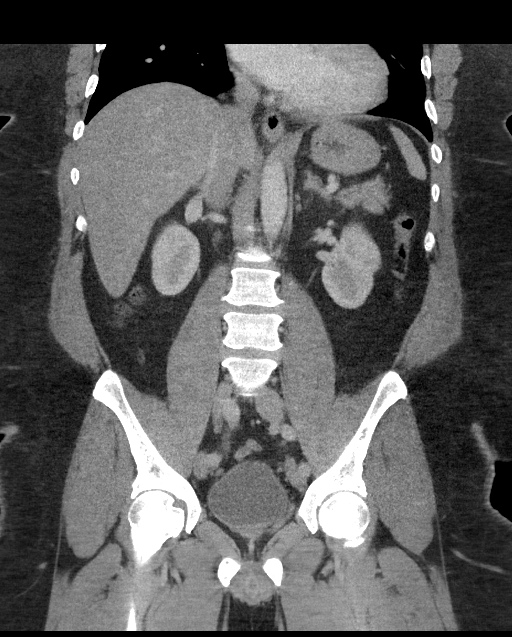
[im 66/118  soft-tissue]
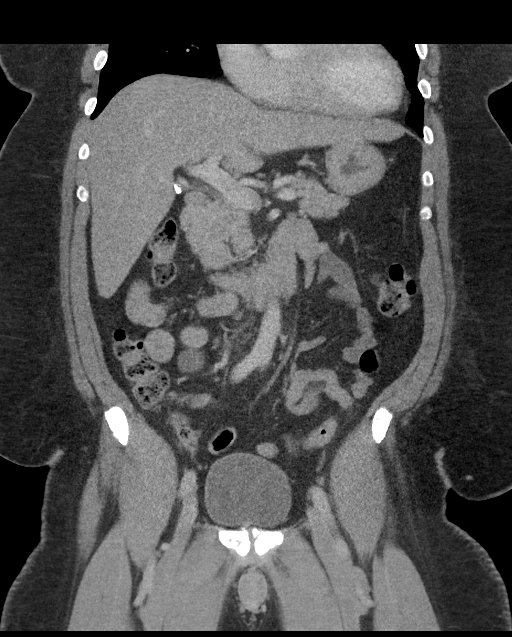

[16 of 46 positions shown; findings below may reference images not displayed]

RADIATION DOSE REDUCTION: This exam was performed according to the
departmental dose-optimization program which includes automated
exposure control, adjustment of the mA and/or kV according to
patient size and/or use of iterative reconstruction technique.

CONTRAST:  125mL OMNIPAQUE IOHEXOL 300 MG/ML  SOLN
FINDINGS: Lower chest: No acute abnormality.

Hepatobiliary: No focal liver abnormality. Status post
cholecystectomy. No biliary dilatation.

Pancreas: No focal lesion. Normal pancreatic contour. No surrounding
inflammatory changes. No main pancreatic ductal dilatation.

Spleen: Normal in size without focal abnormality.

Adrenals/Urinary Tract:

No adrenal nodule bilaterally.

Bilateral kidneys enhance symmetrically.

No hydronephrosis. No hydroureter.

The urinary bladder is unremarkable.

Stomach/Bowel: Stomach is within normal limits. No evidence of bowel
wall thickening or dilatation. Appendix appears normal.

Vascular/Lymphatic: No abdominal aorta or iliac aneurysm. Mild
atherosclerotic plaque of the aorta and its branches. No abdominal,
pelvic, or inguinal lymphadenopathy.

Reproductive: Prostate is unremarkable.

Other: No intraperitoneal free fluid. No intraperitoneal free gas.
No organized fluid collection.

Musculoskeletal:

No abdominal wall hernia or abnormality.

No suspicious lytic or blastic osseous lesions. No acute displaced
fracture. Multilevel degenerative changes of the spine.
IMPRESSION: 1. No acute intra-abdominal or intrapelvic abnormality.
2.  Aortic Atherosclerosis ([id number]).

## 2024-07-19 ENCOUNTER — Emergency Department (HOSPITAL_COMMUNITY)

## 2024-07-19 ENCOUNTER — Other Ambulatory Visit: Payer: Self-pay

## 2024-07-19 ENCOUNTER — Emergency Department (HOSPITAL_COMMUNITY)
Admission: EM | Admit: 2024-07-19 | Discharge: 2024-07-20 | Disposition: A | Discharge status: Home / Self Care | Source: Ambulatory Visit | Attending: Emergency Medicine | Admitting: Emergency Medicine

## 2024-07-19 ENCOUNTER — Encounter (HOSPITAL_COMMUNITY): Payer: Self-pay | Admitting: Emergency Medicine

## 2024-07-19 DIAGNOSIS — M7989 Other specified soft tissue disorders: Secondary | ICD-10-CM | POA: Diagnosis present

## 2024-07-19 DIAGNOSIS — Z79899 Other long term (current) drug therapy: Secondary | ICD-10-CM | POA: Diagnosis not present

## 2024-07-19 DIAGNOSIS — I82411 Acute embolism and thrombosis of right femoral vein: Secondary | ICD-10-CM | POA: Diagnosis not present

## 2024-07-19 DIAGNOSIS — I10 Essential (primary) hypertension: Secondary | ICD-10-CM | POA: Diagnosis not present

## 2024-07-19 DIAGNOSIS — Z7901 Long term (current) use of anticoagulants: Secondary | ICD-10-CM | POA: Insufficient documentation

## 2024-07-19 LAB — COMPREHENSIVE METABOLIC PANEL WITH GFR
ALT: 51 U/L — ABNORMAL HIGH (ref 0–44)
AST: 34 U/L (ref 15–41)
Albumin: 4.1 g/dL (ref 3.5–5.0)
Alkaline Phosphatase: 234 U/L — ABNORMAL HIGH (ref 38–126)
Anion gap: 9 (ref 5–15)
BUN: 15 mg/dL (ref 6–20)
CO2: 28 mmol/L (ref 22–32)
Calcium: 10.5 mg/dL — ABNORMAL HIGH (ref 8.9–10.3)
Chloride: 103 mmol/L (ref 98–111)
Creatinine, Ser: 0.77 mg/dL (ref 0.61–1.24)
GFR, Estimated: >60 mL/min (ref >60)
Glucose, Bld: 96 mg/dL (ref 70–99)
Potassium: 3.9 mmol/L (ref 3.5–5.1)
Sodium: 139 mmol/L (ref 135–145)
Total Bilirubin: 0.4 mg/dL (ref 0.0–1.2)
Total Protein: 8 g/dL (ref 6.5–8.1)

## 2024-07-19 LAB — CBC WITH DIFFERENTIAL/PLATELET
Abs Immature Granulocytes: 0.02 K/uL (ref 0.00–0.07)
Basophils Absolute: 0 K/uL (ref 0.0–0.1)
Basophils Relative: 0 %
Eosinophils Absolute: 1.2 K/uL — ABNORMAL HIGH (ref 0.0–0.5)
Eosinophils Relative: 19 %
HCT: 39.9 % (ref 39.0–52.0)
Hemoglobin: 12.7 g/dL — ABNORMAL LOW (ref 13.0–17.0)
Immature Granulocytes: 0 %
Lymphocytes Relative: 35 %
Lymphs Abs: 2.2 K/uL (ref 0.7–4.0)
MCH: 24.5 pg — ABNORMAL LOW (ref 26.0–34.0)
MCHC: 31.8 g/dL (ref 30.0–36.0)
MCV: 76.9 fL — ABNORMAL LOW (ref 80.0–100.0)
Monocytes Absolute: 0.5 K/uL (ref 0.1–1.0)
Monocytes Relative: 8 %
Neutro Abs: 2.4 K/uL (ref 1.7–7.7)
Neutrophils Relative %: 38 %
Platelets: 182 K/uL (ref 150–400)
RBC: 5.19 MIL/uL (ref 4.22–5.81)
RDW: 15.9 % — ABNORMAL HIGH (ref 11.5–15.5)
WBC: 6.4 K/uL (ref 4.0–10.5)
nRBC: 0 % (ref 0.0–0.2)

## 2024-07-19 LAB — D-DIMER, QUANTITATIVE: D-Dimer, Quant: 2.67 ug{FEU}/mL — ABNORMAL HIGH (ref 0.00–0.50)

## 2024-07-19 MED ORDER — IOHEXOL 350 MG/ML SOLN
100.0000 mL | Freq: Once | INTRAVENOUS | Status: AC | PRN
  Administered 2024-07-20: 100 mL via INTRAVENOUS

## 2024-07-19 NOTE — ED Triage Notes (Addendum)
 Patient had an US  done today. He noticed the right leg was swollen. It was confirmed he has a blood clot and he was told to go to the ED. He has noticed the swelling since this past August. He denies pain and shortness of breath.

## 2024-07-19 NOTE — ED Provider Notes (Signed)
 Timothy Nunez Provider Note   CSN: [id number] Arrival date & time: 07/19/24  8197     Patient presents with: Leg Swelling   Timothy Nunez is a 53 y.o. male.   The history is provided by the patient and medical records. No language interpreter was used.     53 year old male significant history of obesity, GERD, hypertension, presenting with complaint of leg swelling.  Patient states since July of this year he has noticed progressive worsening pain and swelling involving his right lower extremity.  Initially started in his foot and just continued to spread up towards his leg.  He was seen by his PCP in October and had an ultrasound scheduled however he had to reschedule it twice and he finally had it done today.  He was told by the ultrasound technician that he has a significant clot burden involving his right lower extremity towards his thigh and that he should come to the ER for further evaluation.  Unfortunately he was not provided with any record of this visit or imaging of the ultrasound.  Patient otherwise denies having any fever or chills no chest pain or shortness of breath productive cough or hemoptysis.  Denies any active cancer, taking home oral hormones, recent travel, recent surgery, or prolonged bedrest.  No prior history of DVT or PE.  No report of any prior trauma.  Prior to Admission medications   Medication Sig Start Date End Date Taking? Authorizing Provider  acetaminophen  (TYLENOL ) 500 MG tablet Take 500 mg by mouth every 6 (six) hours as needed. For headache    [provider]  amLODipine (NORVASC) 5 MG tablet Take 5 mg by mouth daily. 10/06/16   [provider]  esomeprazole  (NEXIUM ) 40 MG capsule Take 40 mg by mouth daily. 08/05/16   [provider]  naproxen  (NAPROSYN ) 500 MG tablet Take 1 tablet (500 mg total) by mouth 2 (two) times daily. 10/02/17   Khatri, Hina, PA-C  ondansetron  (ZOFRAN -ODT) 8  MG disintegrating tablet Take 1 tablet (8 mg total) by mouth every 8 (eight) hours as needed for nausea or vomiting. 01/27/22   Randol Simmonds, MD  oxyCODONE -acetaminophen  (PERCOCET/ROXICET) 5-325 MG tablet Take 1-2 tablets by mouth every 6 (six) hours as needed for severe pain. 03/22/19   Rondall Agent, PA-C  pantoprazole  (PROTONIX ) 20 MG tablet Take 1 tablet (20 mg total) by mouth 2 (two) times daily for 14 days. 01/27/22 02/10/22  Randol Simmonds, MD  terbinafine (LAMISIL) 250 MG tablet Take 250 mg by mouth daily. 10/06/16   [provider]    Allergies: Patient has no known allergies.    Review of Systems  All other systems reviewed and are negative.   Updated Vital Signs BP (!) 145/77 (BP Location: Right Arm)   Pulse 84   Temp 98 F (36.7 C) (Oral)   Resp 18   SpO2 95%   Physical Exam Constitutional:      General: He is not in acute distress.    Appearance: He is well-developed. He is obese.  HENT:     Head: Atraumatic.  Eyes:     Conjunctiva/sclera: Conjunctivae normal.  Cardiovascular:     Rate and Rhythm: Normal rate and regular rhythm.     Pulses: Normal pulses.     Heart sounds: Normal heart sounds.  Pulmonary:     Effort: Pulmonary effort is normal.     Breath sounds: Normal breath sounds. No wheezing, rhonchi or rales.  Abdominal:     Palpations: Abdomen is soft.     Tenderness: There is no abdominal tenderness.  Musculoskeletal:     Cervical back: Normal range of motion and neck supple.     Right lower leg: Edema present.     Left lower leg: No edema.  Skin:    Capillary Refill: Capillary refill takes less than 2 seconds.     Findings: No rash.  Neurological:     Mental Status: He is alert.     (all labs ordered are listed, but only abnormal results are displayed) Labs Reviewed  D-DIMER, QUANTITATIVE - Abnormal; Notable for the following components:      Result Value   D-Dimer, Quant 2.67 (*)    All other components within normal limits  CBC WITH  DIFFERENTIAL/PLATELET - Abnormal; Notable for the following components:   Hemoglobin 12.7 (*)    MCV 76.9 (*)    MCH 24.5 (*)    RDW 15.9 (*)    Eosinophils Absolute 1.2 (*)    All other components within normal limits  COMPREHENSIVE METABOLIC PANEL WITH GFR - Abnormal; Notable for the following components:   Calcium 10.5 (*)    ALT 51 (*)    Alkaline Phosphatase 234 (*)    All other components within normal limits    EKG: None  Radiology: CT VENOGRAM ABD/PELVIS/LOWER EXT BILAT Result Date: 07/20/2024 EXAM: CTA ABDOMEN AND PELVIS WITH CONTRAST AND RUNOFF CTA OF THE LOWER EXTREMITIES WITH CONTRAST 07/20/2024 12:47:31 AM TECHNIQUE: CTA images of the abdomen, pelvis and lower extremities with intravenous contrast. 100 mL of iohexol  (OMNIPAQUE ) 350 MG/ML injection was administered. Three-dimensional MIP/volume rendered formations were performed. Automated exposure control, iterative reconstruction, and/or weight based adjustment of the mA/kV was utilized to reduce the radiation dose to as low as reasonably achievable. COMPARISON: CT abdomen and pelvis dated 01/27/2022. CLINICAL HISTORY: Pulmonary embolism (PE) suspected, low to intermediate prob, positive D-dimer; CT venogram with runoff. FINDINGS: VASCULATURE: AORTA: Mild atherosclerotic calcifications of the abdominal aorta. No acute finding. No abdominal aortic aneurysm. No dissection. CELIAC TRUNK: No acute finding. No occlusion or significant stenosis. SUPERIOR MESENTERIC ARTERY: No acute finding. No occlusion or significant stenosis. INFERIOR MESENTERIC ARTERY: No acute finding. No occlusion or significant stenosis. RENAL ARTERIES: No acute finding. No occlusion or significant stenosis. RIGHT ILIAC ARTERIES: No acute finding. No occlusion or significant stenosis. RIGHT FEMORAL ARTERIES: No acute finding. No occlusion or significant stenosis. RIGHT POPLITEAL ARTERY: No acute finding. No occlusion or significant stenosis. RIGHT CALF ARTERIES: No  acute finding. No occlusion or significant stenosis. LEFT ILIAC ARTERIES: No acute finding. No occlusion or significant stenosis. LEFT FEMORAL ARTERIES: No acute finding. No occlusion or significant stenosis. LEFT POPLITEAL ARTERY: No acute finding. No occlusion or significant stenosis. LEFT CALF ARTERIES: No acute finding. No occlusion or significant stenosis. VENOUS SYSTEM: On delayed runoff imaging, there appears to be an isolated small subocclusive filling defect in the right femoral vein (image 121). Overall clot burden is very small. ABDOMEN AND PELVIS: LOWER CHEST: Visualized portion of the lower chest demonstrates no acute abnormality. LIVER: The liver is unremarkable. GALLBLADDER AND BILE DUCTS: Gallbladder is unremarkable. No biliary ductal dilatation. SPLEEN: The spleen is unremarkable. PANCREAS: The pancreas is unremarkable. ADRENAL GLANDS: Bilateral adrenal glands demonstrate no acute abnormality. KIDNEYS, URETERS AND BLADDER: 2 mm nondisplaced interpolar right renal calculus (image 25). Subcentimeter right upper pole renal cyst, benign (Bosniak 1). No follow-up is recommended. No hydronephrosis. No evidence of perinephric or periureteral stranding. Urinary  bladder is unremarkable. GI AND Bowel: Normal appendix (image 61). Stomach and duodenal sweep demonstrate no acute abnormality. There is no bowel obstruction. No abnormal bowel wall thickening or distension. REPRODUCTIVE: Prostate is unremarkable. PERITONEUM AND RETROPERITONEUM: No ascites or free air. LYMPH NODES: No evidence of lymphadenopathy. BONES AND SOFT TISSUES: Degenerative changes of the lower thoracic spine. No acute abnormality of the bones. No acute soft tissue abnormality. IMPRESSION: 1. Isolated small subocclusive filling defect in the right femoral vein, corresponding to the patient's known DVT. Electronically signed by: Pinkie Pebbles MD 07/20/2024 01:02 AM EST RP Workstation: [id number]     Procedures   Medications Ordered  in the ED - No data to display                                  Medical Decision Making Amount and/or Complexity of Data Reviewed Radiology: ordered.  Risk Prescription drug management.   BP (!) 145/77 (BP Location: Right Arm)   Pulse 84   Temp 98 F (36.7 C) (Oral)   Resp 18   SpO2 95%   83:41 PM  53 year old male significant history of obesity, GERD, hypertension, presenting with complaint of leg swelling.  Patient states since July of this year he has noticed progressive worsening pain and swelling involving his right lower extremity.  Initially started in his foot and just continued to spread up towards his leg.  He was seen by his PCP in October and had an ultrasound scheduled however he had to reschedule it twice and he finally had it done today.  He was told by the ultrasound technician that he has a significant clot burden involving his right lower extremity towards his thigh and that he should come to the ER for further evaluation.  Unfortunately he was not provided with any record of this visit or imaging of the ultrasound.  Patient otherwise denies having any fever or chills no chest pain or shortness of breath productive cough or hemoptysis.  Denies any active cancer, taking home oral hormones, recent travel, recent surgery, or prolonged bedrest.  No prior history of DVT or PE.  No report of any prior trauma.  On exam, patient resting comfortably in no acute discomfort.  Patient does have 2+ pitting edema to right lower extremity compared to left.  Intact distal pulses.  No erythema or warmth noted.  -Labs ordered, independently viewed and interpreted by me.  Labs remarkable for elevated D-dimer of 2.67.  CTA ordered -The patient was maintained on a cardiac monitor.  I personally viewed and interpreted the cardiac monitored which showed an underlying rhythm of: SR -Imaging independently viewed and interpreted by me and I agree with radiologist's interpretation.  Result  remarkable for CT venogram chest/abd/pelvis/lower extre showing a small isolated R femoral DVT with small clot burden -This patient presents to the ED for concern of leg swelling, this involves an extensive number of treatment options, and is a complaint that carries with it a high risk of complications and morbidity.  The differential diagnosis includes DVT, peripheral edema, cellulitis, strain, sprain -Co morbidities that complicate the patient evaluation includes obesity, GERD, HTN -Treatment includes eliquis -Reevaluation of the patient after these medicines showed that the patient stayed the same -PCP office notes or outside notes reviewed -Discussion with attending Dr. Palumbo -Escalation to admission/observation considered: patients feels much better, is comfortable with discharge, and will follow up with PCP -Prescription medication considered, patient  comfortable with eliquis -Social Determinant of Health considered       Final diagnoses:  Deep vein thrombosis (DVT) of femoral vein of right lower extremity, unspecified chronicity (HCC)    ED Discharge Orders          Ordered    APIXABAN (ELIQUIS) VTE STARTER PACK (10MG  AND 5MG )       Note to Pharmacy: If starter pack unavailable, substitute with seventy-four 5 mg apixaban tabs following the above SIG directions.   07/20/24 0203               Nivia Colon, PA-C 07/20/24 0206    Patsey Lot, MD 07/20/24 [phone]

## 2024-07-20 ENCOUNTER — Encounter (HOSPITAL_COMMUNITY): Payer: Self-pay

## 2024-07-20 ENCOUNTER — Emergency Department (HOSPITAL_COMMUNITY)

## 2024-07-20 MED ORDER — APIXABAN 5 MG PO TABS: 5.0000 mg | ORAL_TABLET | Freq: Two times a day (BID) | ORAL | Status: DC

## 2024-07-20 MED ORDER — APIXABAN 5 MG PO TABS
10.0000 mg | ORAL_TABLET | Freq: Two times a day (BID) | ORAL | Status: DC
  Administered 2024-07-20: 10 mg via ORAL
  Filled 2024-07-20: qty 2

## 2024-07-20 NOTE — Discharge Instructions (Addendum)
 You have been evaluated for your symptoms.  You have been diagnosed with a blood clot to your right thigh involving the femoral vein.  Fortunately it is a small clot and no evidence of blood clot in your chest.  Please begin taking blood thinner medication, Eliquis, as prescribed.  Follow-up closely with your primary care doctor for further evaluation and to help identify the source of the blood clot.  Return if you develop chest pain, shortness of breath, coughing up blood, or if you have other concern.

## 2024-09-06 NOTE — Progress Notes (Unsigned)
 [facility] CONSULT NOTE  Patient Care Team: Timothy Francisco, MD as PCP - General (Family Medicine)   ASSESSMENT & PLAN:  54 y.o.male with history of *** referred to [facility] for history of RLL DVT.   Assessment & Plan   First episode: Second episode: Third episode: Setting: Treatment:  Patient education for risk factors and prevention of clotting We talked about modifiable risk factors.  Prevention of clotting like deep vein thrombosis including: Strong risk factors including fractures of lower limb, hospitalization for severe illness, such as heart failure, myocardial infarction, spinal cord injury, major trauma, hip or knee replacement, and previous VTE. avoid prolonged immobilization and moving extremities every 1-2 hours during long car rides or flights.  Taking a break and moving extremities if working in a job setting with prolonged sitting.   Avoid dehydration, especially in high altitude. Avoid cigarette smoking Avoid use of hormone replacement therapy, estrogen-containing contraceptive in women.   Maintaining healthy lifestyle to prevent development of diabetes.  Weight loss if BMI over 30.  Regular exercises but not extreme.  Stay hydrated with exercises. Other risk factors for clotting are surgery, hospitalization, inflammatory disease or severe infection, trauma or injuries from inflammatory state and stasis. If developing one-sided leg swelling, pain, color change, chest pain, sudden short of breath, difficulty taking deep breaths, taking deep breath with chest discomfort or pain, dizziness or heart racing sensation, go to the emergency room immediately for evaluation. If developing trauma, uncontrolled bleeding, such as bloody stools report ED immediately. Avoid NSAIDs, aspirin while on blood thinner.  Patient should avoid elective surgery in the acute thrombosis period for 3 months.  Discussed bleeding precautions and  avoid high risk activities for falling while on anticoagulation. Anticoagulants do not cause bleeding.  Rather, if bleeding occurs when one is on anticoagulation, it may take longer for the bleeding to stop due to reduce coagulation capacity.  I reviewed with the patient about the plan for care for recurrent ***DVT/PE.  This last episode of blood clot appeared to be ***unprovoked. We discussed about the pros and cons about testing for thrombophilia disorder. For known thrombophilia testing, most of which do not impact the risk of recurrence and do not change the therapeutic options or recommendations for duration of therapy.  These include protein C, protein S and antithrombin deficiencies, factor V Leiden, the prothrombin G20210A mutation and antiphospholipid antibody syndrome.  Patient was informed even if these are negative and the family history is negative, this does not rule out an inheritable component to hypercoagulability. Patient can still develop recurrent venous thrombosis with negative testing. For patients with unprovoked VTE, the risks of recurrent VTE after completing a course of anticoagulant therapy have been estimated to be 10% by 2 years and >30% by 10 years. ASH 2020. Therefore, I do not see a reason to order excessive testing to screen for thrombophilia disorder as it would not change our management. The goal of anticoagulation therapy is for life.   We discussed about various options of anticoagulation therapies including warfarin, low molecular weight heparin such as Lovenox or newer agents such as Rivaroxaban. Some of the risks and benefits discussed including costs involved, the need for monitoring, risks of life-threatening bleeding/hospitalization, reversibility of each agent in the event of bleeding or overdose, safety profile of each drug and taking into account other social issues such as ease of administration of medications, etc. Ultimately, we have made an informed decision  for the patient  to continue his treatment with ***  Finally, at the end of our consultation today, I reinforced the importance of preventive strategies such as avoiding hormonal supplement, avoiding cigarette smoking, keeping up-to-date with screening programs for early cancer detection, frequent ambulation for long distance travel and aggressive DVT prophylaxis in all surgical settings.  ***I have not made a return appointment for the patient to come back. I would be happy to assist in perioperative DVT management in the future should he need any interruption of his anticoagulation therapy for elective procedures.  No orders of the defined types were placed in this encounter.   All questions were answered. The patient knows to call the clinic with any problems, questions or concerns.  I personally spent a total of *** minutes in the care of the patient today including {Time Based Coding:[id number]}.   Timothy JAYSON Chihuahua, MD 1/28/20266:25 PM  CHIEF COMPLAINTS/PURPOSE OF CONSULTATION:  ***  HISTORY OF PRESENTING ILLNESS:  Timothy Nunez 54 y.o. male is here because of RLL DVT.  Report since July of this year he has noticed progressive worsening pain and swelling involving his right lower extremity. Initially started in his foot and just continued to spread up towards his leg. He was seen by his PCP in October and had an ultrasound scheduled however he had to reschedule it twice and he finally had it done in Dec. He was told by the ultrasound technician that he has a significant clot burden involving his right lower extremity towards his thigh and instructed to go to the ER for further evaluation.   07/20/24 CT venogram IMPRESSION: 1. Isolated small subocclusive filling defect in the right femoral vein, corresponding to the patient's known DVT.   MEDICAL HISTORY:  Past Medical History:  Diagnosis Date   Abdominal pain    Blood in stool    Elevated cholesterol    GERD (gastroesophageal  reflux disease)    History of chest pain    03-17-2019  per pt no cardiac s&s since 2012 at ED visit @[facility]  (pt stated had some type of stress test done 15 yrs ago for work was told normal)   History of echocardiogram    received from [facility] [place] , dated 10-05-2018  showed moderate concentric LVF, ef 60-65%, mild LAE   Hyperlipemia    Hypertension    Nausea vomiting and diarrhea    Sleep apnea     SURGICAL HISTORY: Past Surgical History:  Procedure Laterality Date   CHOLECYSTECTOMY  10/12/2011   Procedure: LAPAROSCOPIC CHOLECYSTECTOMY;  Surgeon: Vicenta DELENA Poli, MD;  Location: [facility];  Service: General;  Laterality: N/A;   FOOT SURGERY     left    KNEE ARTHROSCOPY Left 03/22/2019   Procedure: ARTHROSCOPY KNEE, PARTIAL MEDIAL, PARTIAL LATERAL MENISECTOMY, CHONDROPLASTY;  Surgeon: Yvone Rush, MD;  Location: [facility] [place];  Service: Orthopedics;  Laterality: Left;   VASECTOMY      SOCIAL HISTORY: Social History   Socioeconomic History   Marital status: Significant Other    Spouse name: Not on file   Number of children: Not on file   Years of education: Not on file   Highest education level: Not on file  Occupational History    Employer: [employer]  Tobacco Use   Smoking status: Never   Smokeless tobacco: Never   Tobacco comments:    Around tobacco   Substance and Sexual Activity   Alcohol use: Yes    Comment: occ   Drug use:  Yes    Types: Marijuana    Comment: Occasionally   Sexual activity: Yes    Birth control/protection: Surgical  Other Topics Concern   Not on file  Social History Narrative   Not on file   Social Drivers of Health   Tobacco Use: Low Risk (07/19/2024)   Patient History    Smoking Tobacco Use: Never    Smokeless Tobacco Use: Never    Passive Exposure: Not on file  Financial Resource Strain: Not on file  Food Insecurity: Not on file  Transportation Needs: Not on file  Physical Activity: Not on file   Stress: Not on file  Social Connections: Not on file  Intimate Partner Violence: Not on file  Depression (EYV7-0): Not on file  Alcohol Screen: Not on file  Housing: Not on file  Utilities: Not on file  Health Literacy: Not on file    FAMILY HISTORY: Family History  Problem Relation Age of Onset   Hypertension Mother    Stroke Mother    CAD Mother    Colon polyps Father    Hypertension Father    Diabetes Father    Colon cancer Maternal Grandfather    Pancreatic cancer Paternal Grandfather     ALLERGIES:  has no known allergies.  MEDICATIONS:  Current Outpatient Medications  Medication Sig Dispense Refill   acetaminophen  (TYLENOL ) 500 MG tablet Take 500 mg by mouth every 6 (six) hours as needed. For headache     amLODipine (NORVASC) 5 MG tablet Take 5 mg by mouth daily.  1   APIXABAN  (ELIQUIS ) VTE STARTER PACK (10MG  AND 5MG ) Take as directed on package: start with two-5mg  tablets twice daily for 7 days. On day 8, switch to one-5mg  tablet twice daily. 74 each 0   esomeprazole  (NEXIUM ) 40 MG capsule Take 40 mg by mouth daily.  3   naproxen  (NAPROSYN ) 500 MG tablet Take 1 tablet (500 mg total) by mouth 2 (two) times daily. 30 tablet 0   ondansetron  (ZOFRAN -ODT) 8 MG disintegrating tablet Take 1 tablet (8 mg total) by mouth every 8 (eight) hours as needed for nausea or vomiting. 12 tablet 0   oxyCODONE -acetaminophen  (PERCOCET/ROXICET) 5-325 MG tablet Take 1-2 tablets by mouth every 6 (six) hours as needed for severe pain. 30 tablet 0   pantoprazole  (PROTONIX ) 20 MG tablet Take 1 tablet (20 mg total) by mouth 2 (two) times daily for 14 days. 28 tablet 0   terbinafine (LAMISIL) 250 MG tablet Take 250 mg by mouth daily.  1   No current facility-administered medications for this visit.    REVIEW OF SYSTEMS:   All relevant systems were reviewed with the patient and are negative.  PHYSICAL EXAMINATION: ECOG PERFORMANCE STATUS: {CHL ONC ECOG PS:[phone]}  There were no vitals  filed for this visit. There were no vitals filed for this visit.  GENERAL: alert, no distress and comfortable SKIN: skin color normal LUNGS: Effort normal and no respiratory distress.  Clear to auscultation HEART: regular rate & rhythm ABDOMEN: abdomen soft, non-tender Musculoskeletal: no cyanosis and edema.  Right lower extremity *** left lower extremity ***  LABORATORY DATA:  I have reviewed the data as listed   RADIOGRAPHIC STUDIES: I have personally reviewed the radiological images as listed and agreed with the findings in the report. No results found.

## 2024-09-07 ENCOUNTER — Inpatient Hospital Stay

## 2024-10-02 ENCOUNTER — Inpatient Hospital Stay

## 2024-10-02 ENCOUNTER — Inpatient Hospital Stay: Admitting: Hematology and Oncology
